# Patient Record
Sex: Male | Born: 1945 | Race: White | Hispanic: No | Marital: Married | Smoking: Never smoker
Health system: Southern US, Community
[De-identification: ages and names within clinical notes are randomized; demographics above are authoritative.]

## PROBLEM LIST (undated history)

## (undated) DIAGNOSIS — I2510 Atherosclerotic heart disease of native coronary artery without angina pectoris: Secondary | ICD-10-CM

## (undated) DIAGNOSIS — E669 Obesity, unspecified: Secondary | ICD-10-CM

## (undated) DIAGNOSIS — G4733 Obstructive sleep apnea (adult) (pediatric): Secondary | ICD-10-CM

## (undated) DIAGNOSIS — I10 Essential (primary) hypertension: Secondary | ICD-10-CM

## (undated) DIAGNOSIS — N401 Enlarged prostate with lower urinary tract symptoms: Secondary | ICD-10-CM

## (undated) DIAGNOSIS — E119 Type 2 diabetes mellitus without complications: Secondary | ICD-10-CM

## (undated) DIAGNOSIS — E538 Deficiency of other specified B group vitamins: Secondary | ICD-10-CM

## (undated) DIAGNOSIS — I252 Old myocardial infarction: Secondary | ICD-10-CM

## (undated) DIAGNOSIS — K219 Gastro-esophageal reflux disease without esophagitis: Secondary | ICD-10-CM

## (undated) DIAGNOSIS — R079 Chest pain, unspecified: Secondary | ICD-10-CM

## (undated) DIAGNOSIS — E559 Vitamin D deficiency, unspecified: Secondary | ICD-10-CM

## (undated) DIAGNOSIS — F411 Generalized anxiety disorder: Secondary | ICD-10-CM

## (undated) DIAGNOSIS — F4310 Post-traumatic stress disorder, unspecified: Secondary | ICD-10-CM

## (undated) DIAGNOSIS — E785 Hyperlipidemia, unspecified: Secondary | ICD-10-CM

## (undated) HISTORY — DX: Post-traumatic stress disorder, unspecified: F43.10

## (undated) HISTORY — DX: Type 2 diabetes mellitus without complications: E11.9

## (undated) HISTORY — DX: Essential (primary) hypertension: I10

## (undated) HISTORY — PX: TONSILLECTOMY AND ADENOIDECTOMY: SUR1326

## (undated) HISTORY — DX: Obstructive sleep apnea (adult) (pediatric): G47.33

## (undated) HISTORY — DX: Vitamin D deficiency, unspecified: E55.9

## (undated) HISTORY — DX: Old myocardial infarction: I25.2

## (undated) HISTORY — DX: Benign prostatic hyperplasia with lower urinary tract symptoms: N40.1

## (undated) HISTORY — DX: Generalized anxiety disorder: F41.1

## (undated) HISTORY — PX: CORONARY ANGIOPLASTY: SHX604

## (undated) HISTORY — DX: Atherosclerotic heart disease of native coronary artery without angina pectoris: I25.10

## (undated) HISTORY — DX: Obesity, unspecified: E66.9

## (undated) HISTORY — DX: Deficiency of other specified B group vitamins: E53.8

## (undated) HISTORY — DX: Chest pain, unspecified: R07.9

## (undated) HISTORY — DX: Gastro-esophageal reflux disease without esophagitis: K21.9

## (undated) HISTORY — PX: CARDIAC CATHETERIZATION: SHX172

## (undated) HISTORY — DX: Hyperlipidemia, unspecified: E78.5

---

## 2003-05-02 ENCOUNTER — Encounter: Admission: RE | Admit: 2003-05-02 | Discharge: 2003-07-31 | Payer: Self-pay | Admitting: Internal Medicine

## 2004-12-20 ENCOUNTER — Ambulatory Visit: Payer: Self-pay | Admitting: Internal Medicine

## 2004-12-20 ENCOUNTER — Inpatient Hospital Stay (HOSPITAL_COMMUNITY): Admission: EM | Admit: 2004-12-20 | Discharge: 2004-12-24 | Payer: Self-pay | Admitting: Internal Medicine

## 2005-01-10 ENCOUNTER — Ambulatory Visit: Payer: Self-pay | Admitting: Cardiology

## 2005-01-10 ENCOUNTER — Inpatient Hospital Stay (HOSPITAL_COMMUNITY): Admission: EM | Admit: 2005-01-10 | Discharge: 2005-01-11 | Payer: Self-pay | Admitting: Emergency Medicine

## 2005-03-30 HISTORY — PX: ESOPHAGOGASTRODUODENOSCOPY: SHX1529

## 2013-10-02 HISTORY — PX: COLONOSCOPY: SHX174

## 2017-10-25 ENCOUNTER — Telehealth: Payer: Self-pay | Admitting: Cardiology

## 2017-10-25 DIAGNOSIS — I2510 Atherosclerotic heart disease of native coronary artery without angina pectoris: Secondary | ICD-10-CM

## 2017-10-25 HISTORY — DX: Atherosclerotic heart disease of native coronary artery without angina pectoris: I25.10

## 2017-10-25 NOTE — Telephone Encounter (Signed)
Advised records have not been received, will continue efforts. Appointment moved up to tomorrow at 9:40 am per patient request.

## 2017-10-25 NOTE — Progress Notes (Signed)
Cardiology Office Note:    Date:  10/26/2017   ID:  Adam Kim, DOB 08-13-1946, MRN [id number]  PCP:  Raelyn Number, MD  Cardiologist:  Shirlee More, MD   Referring MD: Raelyn Number, MD  ASSESSMENT:    1. Coronary artery disease involving native coronary artery of native heart with angina pectoris (HCC)   2. Chest pain, unspecified type   3. Hyperlipidemia, unspecified hyperlipidemia type   4. Positive D dimer   5. Edema, unspecified type   6. Type 2 diabetes mellitus with complication, unspecified whether long term insulin use (HCC)    PLAN:    In order of problems listed above:  1. Course appears stable he has had one single episode of angina.  He will continue current medical treatment he is at risk for recurrent severe CAD will undergo stress myocardial perfusion study and follow-up with me in the office.  For completeness we will do a troponin today 2. See above symptoms most consistent with atypical angina  3. stable continue statin 4. D-dimer is elevated with age after 3 top normal his age x10 d-dimer age corrected is normal he had no shortness of breath no history of venous thromboembolism and at this time I would not perform CTA. 5. Likely due to venous insufficiency await results of previous duplex is requested 6. Stable managed by his PCP  Next appointment 3 weeks   Medication Adjustments/Labs and Tests Ordered: Current medicines are reviewed at length with the patient today.  Concerns regarding medicines are outlined above.  Orders Placed This Encounter  Procedures  . Troponin I  . Myocardial Perfusion Imaging   No orders of the defined types were placed in this encounter.    Chief Complaint  Patient presents with  . New Patient (Initial Visit)  . Coronary Artery Disease    History of Present Illness:    Adam Kim is a 72 y.o. male who is being seen today for the evaluation of CAD at the request of Haque, Imran P, MD.  He relates I had  seen him many years ago but certainly greater than 3-1/2 no records.  His cardiac intervention was performed at [facility] records requested.  He has had no cardiology ischemic evaluation evaluation since that time.  Last Friday while shaving he had a vague tightness substernally radiating to both sides of his chest and lasted for a few minutes and he sought medical attention.  He was not short of breath.  As part of his evaluation d-dimer is minimally elevated but corrected for age is normal he was not short of breath.  He relates in 2 separate occasions he has had duplex of his lower extremities because of edema and has no history of venous thromboembolism.  He has had no recurrent chest discomfort.  He is quite concerned and somewhat apprehensive since that day.  An echocardiogram was done as an outpatient result described as normal EKG at the office visit was normal and his CK-MB was performed that was normal.  His greatest concern is why he has edema.  I told him is not on the basis of heart failure and when asked he tells me he has large amounts of salt to his diet I suspect is chronic venous insufficiency I requested his previous duplexes and I asked him to begin sodium restriction. He was seen for chest pain by PCP, CPK MB, CBC normal, Chol 115, LDL 49 HDL 45 and echo 10/22/17 was normal.  Past  Medical History:  Diagnosis Date  . Benign prostatic hyperplasia with lower urinary tract symptoms 10/26/2017  . CAD (coronary artery disease), native coronary artery 10/25/2017  . Chest pain 10/26/2017  . Coronary artery disease   . Gastro-esophageal reflux disease without esophagitis 10/26/2017  . Generalized anxiety disorder 10/26/2017  . Hyperlipidemia   . Hypertension 10/26/2017  . Obesity 10/26/2017  . Obstructive sleep apnea 10/26/2017  . Old myocardial infarct 10/26/2017  . Type 2 diabetes mellitus ([place]) 10/26/2017  . Vitamin B 12 deficiency 10/26/2017  . Vitamin D deficiency 10/26/2017     Past Surgical History:  Procedure Laterality Date  . CARDIAC CATHETERIZATION    . CORONARY ANGIOPLASTY    . TONSILLECTOMY AND ADENOIDECTOMY      Current Medications: Current Meds  Medication Sig  . atorvastatin (LIPITOR) 10 MG tablet Take 10 mg by mouth daily.  Marland Kitchen CALCIUM-MAGNESIUM-VITAMIN D PO Take 1 tablet by mouth daily.  . candesartan (ATACAND) 4 MG tablet Take 4 mg by mouth daily.  . clopidogrel (PLAVIX) 75 MG tablet Take 75 mg by mouth daily.  . Cyanocobalamin 1000 MCG/ML KIT Inject 1,000 mcg as directed every 30 (thirty) days.  . diazepam (VALIUM) 5 MG tablet Take 5 mg by mouth every 6 (six) hours as needed for anxiety.  . empagliflozin (JARDIANCE) 25 MG TABS tablet Take 25 mg by mouth daily.  . fluticasone (FLONASE) 50 MCG/ACT nasal spray Place into both nostrils daily as needed for allergies or rhinitis.  . metFORMIN (GLUCOPHAGE) 500 MG tablet Take 500 mg by mouth. Takes 2 tablets in the am, 1 tablet at noon, and 2 tablets in the pm  . metoprolol succinate (TOPROL-XL) 25 MG 24 hr tablet Take 25 mg by mouth daily.  . montelukast (SINGULAIR) 10 MG tablet Take 10 mg by mouth at bedtime.  . pioglitazone (ACTOS) 45 MG tablet Take 45 mg by mouth daily.  . potassium chloride (K-DUR,KLOR-CON) 10 MEQ tablet Take 10 mEq by mouth daily.  . ranitidine (ZANTAC) 150 MG tablet Take 150 mg by mouth daily.  . sitaGLIPtin (JANUVIA) 100 MG tablet Take 100 mg by mouth daily.  . traZODone (DESYREL) 50 MG tablet Take 50 mg by mouth at bedtime.  . vitamin E 400 UNIT capsule Take 400 Units by mouth daily.  . [DISCONTINUED] aspirin EC 81 MG tablet Take 81 mg by mouth daily.     Allergies:   Iodine and Shellfish allergy   Social History   Socioeconomic History  . Marital status: Married    Spouse name: None  . Number of children: None  . Years of education: None  . Highest education level: None  Social Needs  . Financial resource strain: None  . Food insecurity - worry: None  . Food  insecurity - inability: None  . Transportation needs - medical: None  . Transportation needs - non-medical: None  Occupational History  . None  Tobacco Use  . Smoking status: Never Smoker  . Smokeless tobacco: Never Used  Substance and Sexual Activity  . Alcohol use: No    Frequency: Never  . Drug use: No  . Sexual activity: None  Other Topics Concern  . None  Social History Narrative  . None     Family History: The patient's family history includes Diabetes in his mother.  ROS:   ROS Please see the history of present illness.     All other systems reviewed and are negative.  EKGs/Labs/Other Studies Reviewed:    The following studies  were reviewed today:  D Dimer  EKG:  EKG 10/22/17 [place] poor R wave progression low voltage EKG    Physical Exam:    VS:  BP 116/60 (BP Location: Right Arm, Patient Position: Sitting, Cuff Size: Large)   Pulse 85   Ht 5' 7.5" (1.715 m)   Wt 263 lb 12.8 oz (119.7 kg)   SpO2 98%   BMI 40.71 kg/m     Wt Readings from Last 3 Encounters:  10/26/17 263 lb 12.8 oz (119.7 kg)     GEN: Obese BMI >40 , well developed in no acute distress HEENT: Normal NECK: No JVD; No carotid bruits LYMPHATICS: No lymphadenopathy CARDIAC: no chest wall tenderness RRR, no murmurs, rubs, gallops RESPIRATORY:  Clear to auscultation without rales, wheezing or rhonchi  ABDOMEN: Soft, non-tender, non-distended MUSCULOSKELETAL:  No edema; No deformity  SKIN: Warm and dry NEUROLOGIC:  Alert and oriented x 3 PSYCHIATRIC:  Normal affect     Signed, Shirlee More, MD  10/26/2017 11:43 AM    [place]

## 2017-10-25 NOTE — Telephone Encounter (Signed)
Patient has some questions and wanted to know if we got results from Dr. Judith Bl[place]Hawk in [place].

## 2017-10-26 ENCOUNTER — Encounter: Payer: Self-pay | Admitting: Cardiology

## 2017-10-26 ENCOUNTER — Ambulatory Visit (INDEPENDENT_AMBULATORY_CARE_PROVIDER_SITE_OTHER): Payer: Medicare Other | Admitting: Cardiology

## 2017-10-26 VITALS — BP 116/60 | HR 85 | Ht 67.5 in | Wt 263.8 lb

## 2017-10-26 DIAGNOSIS — E669 Obesity, unspecified: Secondary | ICD-10-CM

## 2017-10-26 DIAGNOSIS — I10 Essential (primary) hypertension: Secondary | ICD-10-CM

## 2017-10-26 DIAGNOSIS — G4733 Obstructive sleep apnea (adult) (pediatric): Secondary | ICD-10-CM | POA: Insufficient documentation

## 2017-10-26 DIAGNOSIS — E538 Deficiency of other specified B group vitamins: Secondary | ICD-10-CM | POA: Insufficient documentation

## 2017-10-26 DIAGNOSIS — E559 Vitamin D deficiency, unspecified: Secondary | ICD-10-CM | POA: Insufficient documentation

## 2017-10-26 DIAGNOSIS — K219 Gastro-esophageal reflux disease without esophagitis: Secondary | ICD-10-CM

## 2017-10-26 DIAGNOSIS — I25119 Atherosclerotic heart disease of native coronary artery with unspecified angina pectoris: Secondary | ICD-10-CM

## 2017-10-26 DIAGNOSIS — R609 Edema, unspecified: Secondary | ICD-10-CM

## 2017-10-26 DIAGNOSIS — E118 Type 2 diabetes mellitus with unspecified complications: Secondary | ICD-10-CM

## 2017-10-26 DIAGNOSIS — I252 Old myocardial infarction: Secondary | ICD-10-CM

## 2017-10-26 DIAGNOSIS — N401 Enlarged prostate with lower urinary tract symptoms: Secondary | ICD-10-CM | POA: Insufficient documentation

## 2017-10-26 DIAGNOSIS — F411 Generalized anxiety disorder: Secondary | ICD-10-CM | POA: Insufficient documentation

## 2017-10-26 DIAGNOSIS — R079 Chest pain, unspecified: Secondary | ICD-10-CM | POA: Diagnosis not present

## 2017-10-26 DIAGNOSIS — R7989 Other specified abnormal findings of blood chemistry: Secondary | ICD-10-CM

## 2017-10-26 DIAGNOSIS — E785 Hyperlipidemia, unspecified: Secondary | ICD-10-CM

## 2017-10-26 DIAGNOSIS — E119 Type 2 diabetes mellitus without complications: Secondary | ICD-10-CM

## 2017-10-26 HISTORY — DX: Vitamin D deficiency, unspecified: E55.9

## 2017-10-26 HISTORY — DX: Old myocardial infarction: I25.2

## 2017-10-26 HISTORY — DX: Deficiency of other specified B group vitamins: E53.8

## 2017-10-26 HISTORY — DX: Chest pain, unspecified: R07.9

## 2017-10-26 HISTORY — DX: Type 2 diabetes mellitus without complications: E11.9

## 2017-10-26 HISTORY — DX: Obstructive sleep apnea (adult) (pediatric): G47.33

## 2017-10-26 HISTORY — DX: Benign prostatic hyperplasia with lower urinary tract symptoms: N40.1

## 2017-10-26 HISTORY — DX: Generalized anxiety disorder: F41.1

## 2017-10-26 HISTORY — DX: Essential (primary) hypertension: I10

## 2017-10-26 HISTORY — DX: Obesity, unspecified: E66.9

## 2017-10-26 HISTORY — DX: Gastro-esophageal reflux disease without esophagitis: K21.9

## 2017-10-26 NOTE — Patient Instructions (Signed)
Medication Instructions:  Your physician recommends that you continue on your current medications as directed. Please refer to the Current Medication list given to you today.  Labwork: Your physician recommends that you return for lab work in: today. Troponin  Testing/Procedures: Your physician has requested that you have en exercise stress myoview. For further information please visit [email or web address]. Please follow instruction sheet, as given.  Follow-Up: Your physician recommends that you schedule a follow-up appointment in: 4 weeks.  Any Other Special Instructions Will Be Listed Below (If Applicable).     If you need a refill on your cardiac medications before your next appointment, please call your pharmacy.

## 2017-10-27 ENCOUNTER — Ambulatory Visit: Payer: Self-pay | Admitting: Cardiology

## 2017-10-27 LAB — TROPONIN I

## 2017-11-10 ENCOUNTER — Encounter (HOSPITAL_COMMUNITY): Payer: Medicare Other

## 2017-11-22 ENCOUNTER — Encounter (HOSPITAL_COMMUNITY): Payer: Medicare Other

## 2017-11-23 ENCOUNTER — Ambulatory Visit: Payer: Medicare Other | Admitting: Cardiology

## 2017-12-06 ENCOUNTER — Telehealth (HOSPITAL_COMMUNITY): Payer: Self-pay | Admitting: *Deleted

## 2017-12-06 NOTE — Telephone Encounter (Signed)
Patient given detailed instructions per Myocardial Perfusion Study Information Sheet for the test on 12/08/17 at 0730. Patient notified to arrive 15 minutes early and that it is imperative to arrive on time for appointment to keep from having the test rescheduled.  If you need to cancel or reschedule your appointment, please call the office within 24 hours of your appointment. . Patient verbalized understanding.Adam Kim, Adelene IdlerCynthia W

## 2017-12-08 ENCOUNTER — Ambulatory Visit (HOSPITAL_COMMUNITY): Payer: Medicare Other | Attending: Cardiology

## 2017-12-08 ENCOUNTER — Encounter (INDEPENDENT_AMBULATORY_CARE_PROVIDER_SITE_OTHER): Payer: Self-pay

## 2017-12-08 DIAGNOSIS — I25119 Atherosclerotic heart disease of native coronary artery with unspecified angina pectoris: Secondary | ICD-10-CM | POA: Insufficient documentation

## 2017-12-08 DIAGNOSIS — I10 Essential (primary) hypertension: Secondary | ICD-10-CM | POA: Insufficient documentation

## 2017-12-08 DIAGNOSIS — I259 Chronic ischemic heart disease, unspecified: Secondary | ICD-10-CM | POA: Diagnosis not present

## 2017-12-08 DIAGNOSIS — I2510 Atherosclerotic heart disease of native coronary artery without angina pectoris: Secondary | ICD-10-CM | POA: Insufficient documentation

## 2017-12-08 DIAGNOSIS — R079 Chest pain, unspecified: Secondary | ICD-10-CM

## 2017-12-08 DIAGNOSIS — E119 Type 2 diabetes mellitus without complications: Secondary | ICD-10-CM | POA: Diagnosis not present

## 2017-12-08 LAB — MYOCARDIAL PERFUSION IMAGING
CHL CUP NUCLEAR SDS: 2
CHL CUP NUCLEAR SRS: 4
CHL CUP RESTING HR STRESS: 67 {beats}/min
LHR: 0.42
LV dias vol: 113 mL (ref 62–150)
LV sys vol: 41 mL
NUC STRESS TID: 1.01
Peak HR: 93 {beats}/min
SSS: 6

## 2017-12-08 MED ORDER — REGADENOSON 0.4 MG/5ML IV SOLN
0.4000 mg | Freq: Once | INTRAVENOUS | Status: AC
  Administered 2017-12-08: 0.4 mg via INTRAVENOUS

## 2017-12-08 MED ORDER — TECHNETIUM TC 99M TETROFOSMIN IV KIT
11.0000 | PACK | Freq: Once | INTRAVENOUS | Status: AC | PRN
  Administered 2017-12-08: 11 via INTRAVENOUS
  Filled 2017-12-08: qty 11

## 2017-12-08 MED ORDER — TECHNETIUM TC 99M TETROFOSMIN IV KIT
31.1000 | PACK | Freq: Once | INTRAVENOUS | Status: AC | PRN
  Administered 2017-12-08: 31.1 via INTRAVENOUS
  Filled 2017-12-08: qty 32

## 2017-12-17 NOTE — Progress Notes (Signed)
Cardiology Office Note:    Date:  12/20/2017   ID:  Adam Kim, DOB 08-18-1946, MRN [id number]  PCP:  Raelyn Number, MD  Cardiologist:  Shirlee More, MD    Referring MD: Raelyn Number, MD    ASSESSMENT:    1. Coronary artery disease of native artery of native heart with stable angina pectoris ([place])   2. Essential hypertension   3. Hyperlipidemia, unspecified hyperlipidemia type    PLAN:    In order of problems listed above:  1. Stable his myocardial perfusion study showed minimal ischemia is having no angina at this time will continue medical therapy with clopidogrel beta-blocker and high intensity statin.  If he were to have frequent angina he would benefit from coronary angiography. 2. Stable continue current treatment including ARB 3. Stable continue his statin   Next appointment: 6 months   Medication Adjustments/Labs and Tests Ordered: Current medicines are reviewed at length with the patient today.  Concerns regarding medicines are outlined above.  No orders of the defined types were placed in this encounter.  No orders of the defined types were placed in this encounter.   Chief Complaint  Patient presents with  . Follow-up    to discuss stress test   . Coronary Artery Disease  . Hypertension  . Hyperlipidemia    History of Present Illness:    Adam Kim is a 72 y.o. male with a hx of CAD last seen 10/26/17.  ASSESSMENT:    10/26/17   1. Coronary artery disease involving native coronary artery of native heart with angina pectoris (HCC)   2. Chest pain, unspecified type   3. Hyperlipidemia, unspecified hyperlipidemia type   4. Positive D dimer   5. Edema, unspecified type   6. Type 2 diabetes mellitus with complication, unspecified whether long term insulin use (HCC)    PLAN:    In order of problems listed above:  1. Course appears stable he has had one single episode of angina.  He will continue current medical treatment he is at  risk for recurrent severe CAD will undergo stress myocardial perfusion study and follow-up with me in the office.  For completeness we will do a troponin today 2. See above symptoms most consistent with atypical angina  3. stable continue statin 4. D-dimer is elevated with age after 22 top normal his age x10 d-dimer age corrected is normal he had no shortness of breath no history of venous thromboembolism and at this time I would not perform CTA. 5. Likely due to venous insufficiency await results of previous duplex is requested 6. Stable managed by his PCP   Compliance with diet, lifestyle and medications: Yes He returns today in follow-up to his myocardial perfusion study that showed old infarction and minimal peri-infarct ischemia.  Clinically he is doing well he has no shortness of breath angina palpitation or syncope and until recently was in a regular exercise walking program.  Again is concerned about unilateral swelling in the right lower extremity is chronic he has had previous evaluation without evidence of deep vein thrombosis. Past Medical History:  Diagnosis Date  . Benign prostatic hyperplasia with lower urinary tract symptoms 10/26/2017  . CAD (coronary artery disease), native coronary artery 10/25/2017  . Chest pain 10/26/2017  . Coronary artery disease   . Gastro-esophageal reflux disease without esophagitis 10/26/2017  . Generalized anxiety disorder 10/26/2017  . Hyperlipidemia   . Hypertension 10/26/2017  . Obesity 10/26/2017  . Obstructive sleep apnea 10/26/2017  .  Old myocardial infarct 10/26/2017  . Type 2 diabetes mellitus (HCC) 10/26/2017  . Vitamin B 12 deficiency 10/26/2017  . Vitamin D deficiency 10/26/2017    Past Surgical History:  Procedure Laterality Date  . CARDIAC CATHETERIZATION    . CORONARY ANGIOPLASTY    . TONSILLECTOMY AND ADENOIDECTOMY      Current Medications: Current Meds  Medication Sig  . atorvastatin (LIPITOR) 10 MG tablet Take 10 mg by mouth  daily.  . CALCIUM-MAGNESIUM-VITAMIN D PO Take 1 tablet by mouth daily.  . candesartan (ATACAND) 4 MG tablet Take 4 mg by mouth daily.  . clopidogrel (PLAVIX) 75 MG tablet Take 75 mg by mouth daily.  . Cyanocobalamin 1000 MCG/ML KIT Inject 1,000 mcg as directed every 30 (thirty) days.  . diazepam (VALIUM) 5 MG tablet Take 5 mg by mouth every 6 (six) hours as needed for anxiety.  . empagliflozin (JARDIANCE) 25 MG TABS tablet Take 25 mg by mouth daily.  . fluticasone (FLONASE) 50 MCG/ACT nasal spray Place into both nostrils daily as needed for allergies or rhinitis.  . metFORMIN (GLUCOPHAGE) 500 MG tablet Take 500 mg by mouth. Takes 2 tablets in the am, 1 tablet at noon, and 2 tablets in the pm  . metoprolol succinate (TOPROL-XL) 25 MG 24 hr tablet Take 25 mg by mouth daily.  . montelukast (SINGULAIR) 10 MG tablet Take 10 mg by mouth at bedtime.  . pioglitazone (ACTOS) 45 MG tablet Take 45 mg by mouth daily.  . potassium chloride (K-DUR,KLOR-CON) 10 MEQ tablet Take 10 mEq by mouth daily.  . ranitidine (ZANTAC) 150 MG tablet Take 150 mg by mouth daily.  . traZODone (DESYREL) 50 MG tablet Take 50 mg by mouth at bedtime.  . vitamin E 400 UNIT capsule Take 400 Units by mouth daily.     Allergies:   Iodine and Shellfish allergy   Social History   Socioeconomic History  . Marital status: Married    Spouse name: Not on file  . Number of children: Not on file  . Years of education: Not on file  . Highest education level: Not on file  Occupational History  . Not on file  Social Needs  . Financial resource strain: Not on file  . Food insecurity:    Worry: Not on file    Inability: Not on file  . Transportation needs:    Medical: Not on file    Non-medical: Not on file  Tobacco Use  . Smoking status: Never Smoker  . Smokeless tobacco: Never Used  Substance and Sexual Activity  . Alcohol use: No    Frequency: Never  . Drug use: No  . Sexual activity: Not on file  Lifestyle  . Physical  activity:    Days per week: Not on file    Minutes per session: Not on file  . Stress: Not on file  Relationships  . Social connections:    Talks on phone: Not on file    Gets together: Not on file    Attends religious service: Not on file    Active member of club or organization: Not on file    Attends meetings of clubs or organizations: Not on file    Relationship status: Not on file  Other Topics Concern  . Not on file  Social History Narrative  . Not on file     Family History: The patient's family history includes Diabetes in his mother. ROS:   Please see the history of present illness.      All other systems reviewed and are negative.  EKGs/Labs/Other Studies Reviewed:    The following studies were reviewed today: MPI 30-Dec-2017 Study Highlights    Nuclear stress EF: 63%. Normal wall motion  There was no ST segment deviation noted during stress.  Defect 1: There is a medium defect present in the apical inferior and apex location.The defect is partially reversible. This represents infarct and ischemia.  Findings consistent with prior apical inferior myocardial infarction with peri-infarct ischemia.  This is an intermediate risk study.    Recent Labs: No results found for requested labs within last 8760 hours.  Recent Lipid Panel No results found for: CHOL, TRIG, HDL, CHOLHDL, VLDL, LDLCALC, LDLDIRECT  Physical Exam:    VS:  BP 120/60   Pulse 75   Ht 5' 7.5" (1.715 m)   Wt 264 lb 1.9 oz (119.8 kg)   SpO2 98%   BMI 40.76 kg/m     Wt Readings from Last 3 Encounters:  12/20/17 264 lb 1.9 oz (119.8 kg)  2017/12/30 263 lb (119.3 kg)  10/26/17 263 lb 12.8 oz (119.7 kg)     GEN:  Well nourished, well developed in no acute distress HEENT: Normal NECK: No JVD; No carotid bruits LYMPHATICS: No lymphadenopathy CARDIAC: RRR, no murmurs, rubs, gallops RESPIRATORY:  Clear to auscultation without rales, wheezing or rhonchi  ABDOMEN: Soft, non-tender,  non-distended MUSCULOSKELETAL:  No edema; No deformity  SKIN: Warm and dry NEUROLOGIC:  Alert and oriented x 3 PSYCHIATRIC:  Normal affect    Signed, Shirlee More, MD  12/20/2017 12:41 PM    [facility]

## 2017-12-20 ENCOUNTER — Encounter: Payer: Self-pay | Admitting: Cardiology

## 2017-12-20 ENCOUNTER — Ambulatory Visit (INDEPENDENT_AMBULATORY_CARE_PROVIDER_SITE_OTHER): Payer: Medicare Other | Admitting: Cardiology

## 2017-12-20 VITALS — BP 120/60 | HR 75 | Ht 67.5 in | Wt 264.1 lb

## 2017-12-20 DIAGNOSIS — E785 Hyperlipidemia, unspecified: Secondary | ICD-10-CM

## 2017-12-20 DIAGNOSIS — I10 Essential (primary) hypertension: Secondary | ICD-10-CM

## 2017-12-20 DIAGNOSIS — I25118 Atherosclerotic heart disease of native coronary artery with other forms of angina pectoris: Secondary | ICD-10-CM | POA: Diagnosis not present

## 2017-12-20 NOTE — Patient Instructions (Addendum)
Medication Instructions:  Your physician recommends that you continue on your current medications as directed. Please refer to the Current Medication list given to you today.  Labwork: None  Testing/Procedures: None  Follow-Up: Your physician wants you to follow-up in: 6 months. You will receive a reminder letter in the mail two months in advance. If you don't receive a letter, please call our office to schedule the follow-up appointment.  Any Other Special Instructions Will Be Listed Below (If Applicable).     If you need a refill on your cardiac medications before your next appointment, please call your pharmacy.    Activity 20-30 minutes a day

## 2017-12-23 ENCOUNTER — Telehealth: Payer: Self-pay | Admitting: Cardiology

## 2017-12-23 NOTE — Telephone Encounter (Signed)
Wants to know a good vascular surgeon

## 2017-12-23 NOTE — Telephone Encounter (Signed)
Patient states he was driving whenever I returned his call. Patient wanted to call back when he is able to write the information down. Dr. Dulce SellarMunley recommends Dr. Charlena CrossWells Brabham IV, MD for vascular surgery.

## 2017-12-24 NOTE — Telephone Encounter (Signed)
Contacted patient, he was in a meeting, will return call.

## 2017-12-27 NOTE — Telephone Encounter (Signed)
Patient advised of vascular surgeon's name.

## 2018-05-13 DIAGNOSIS — R600 Localized edema: Secondary | ICD-10-CM | POA: Insufficient documentation

## 2019-03-27 ENCOUNTER — Encounter (HOSPITAL_COMMUNITY): Payer: Self-pay

## 2019-03-27 ENCOUNTER — Inpatient Hospital Stay (HOSPITAL_COMMUNITY)
Admission: EM | Admit: 2019-03-27 | Discharge: 2019-04-02 | DRG: 439 | Disposition: A | Discharge status: Home / Self Care | Payer: Medicare Other | Attending: Student | Admitting: Student

## 2019-03-27 ENCOUNTER — Other Ambulatory Visit: Payer: Self-pay

## 2019-03-27 DIAGNOSIS — E119 Type 2 diabetes mellitus without complications: Secondary | ICD-10-CM | POA: Diagnosis present

## 2019-03-27 DIAGNOSIS — Z9861 Coronary angioplasty status: Secondary | ICD-10-CM

## 2019-03-27 DIAGNOSIS — Z1159 Encounter for screening for other viral diseases: Secondary | ICD-10-CM

## 2019-03-27 DIAGNOSIS — K219 Gastro-esophageal reflux disease without esophagitis: Secondary | ICD-10-CM | POA: Diagnosis present

## 2019-03-27 DIAGNOSIS — K859 Acute pancreatitis without necrosis or infection, unspecified: Principal | ICD-10-CM | POA: Diagnosis present

## 2019-03-27 DIAGNOSIS — Z7984 Long term (current) use of oral hypoglycemic drugs: Secondary | ICD-10-CM

## 2019-03-27 DIAGNOSIS — F411 Generalized anxiety disorder: Secondary | ICD-10-CM | POA: Diagnosis present

## 2019-03-27 DIAGNOSIS — E1169 Type 2 diabetes mellitus with other specified complication: Secondary | ICD-10-CM

## 2019-03-27 DIAGNOSIS — I10 Essential (primary) hypertension: Secondary | ICD-10-CM | POA: Diagnosis present

## 2019-03-27 DIAGNOSIS — N401 Enlarged prostate with lower urinary tract symptoms: Secondary | ICD-10-CM | POA: Diagnosis present

## 2019-03-27 DIAGNOSIS — Z7902 Long term (current) use of antithrombotics/antiplatelets: Secondary | ICD-10-CM

## 2019-03-27 DIAGNOSIS — I252 Old myocardial infarction: Secondary | ICD-10-CM

## 2019-03-27 DIAGNOSIS — G4733 Obstructive sleep apnea (adult) (pediatric): Secondary | ICD-10-CM | POA: Diagnosis present

## 2019-03-27 DIAGNOSIS — Z79899 Other long term (current) drug therapy: Secondary | ICD-10-CM

## 2019-03-27 DIAGNOSIS — I2510 Atherosclerotic heart disease of native coronary artery without angina pectoris: Secondary | ICD-10-CM | POA: Diagnosis present

## 2019-03-27 DIAGNOSIS — Z6841 Body Mass Index (BMI) 40.0 and over, adult: Secondary | ICD-10-CM

## 2019-03-27 DIAGNOSIS — Z833 Family history of diabetes mellitus: Secondary | ICD-10-CM

## 2019-03-27 DIAGNOSIS — E785 Hyperlipidemia, unspecified: Secondary | ICD-10-CM | POA: Diagnosis present

## 2019-03-27 LAB — COMPREHENSIVE METABOLIC PANEL
ALT: 25 U/L (ref 0–44)
AST: 49 U/L — ABNORMAL HIGH (ref 15–41)
Albumin: 3.8 g/dL (ref 3.5–5.0)
Alkaline Phosphatase: 61 U/L (ref 38–126)
Anion gap: 11 (ref 5–15)
BUN: 16 mg/dL (ref 8–23)
CO2: 23 mmol/L (ref 22–32)
Calcium: 8.8 mg/dL — ABNORMAL LOW (ref 8.9–10.3)
Chloride: 100 mmol/L (ref 98–111)
Creatinine, Ser: 1.16 mg/dL (ref 0.61–1.24)
GFR calc Af Amer: >60 mL/min (ref >60)
GFR calc non Af Amer: >60 mL/min (ref >60)
Glucose, Bld: 193 mg/dL — ABNORMAL HIGH (ref 70–99)
Potassium: 4 mmol/L (ref 3.5–5.1)
Sodium: 134 mmol/L — ABNORMAL LOW (ref 135–145)
Total Bilirubin: 0.7 mg/dL (ref 0.3–1.2)
Total Protein: 6.3 g/dL — ABNORMAL LOW (ref 6.5–8.1)

## 2019-03-27 LAB — CBC
HCT: 39.2 % (ref 39.0–52.0)
Hemoglobin: 12.3 g/dL — ABNORMAL LOW (ref 13.0–17.0)
MCH: 29.5 pg (ref 26.0–34.0)
MCHC: 31.4 g/dL (ref 30.0–36.0)
MCV: 94 fL (ref 80.0–100.0)
Platelets: 235 10*3/uL (ref 150–400)
RBC: 4.17 MIL/uL — ABNORMAL LOW (ref 4.22–5.81)
RDW: 13.9 % (ref 11.5–15.5)
WBC: 11 10*3/uL — ABNORMAL HIGH (ref 4.0–10.5)
nRBC: 0 % (ref 0.0–0.2)

## 2019-03-27 LAB — LIPASE, BLOOD: Lipase: 1495 U/L — ABNORMAL HIGH (ref 11–51)

## 2019-03-27 LAB — CBG MONITORING, ED: Glucose-Capillary: 193 mg/dL — ABNORMAL HIGH (ref 70–99)

## 2019-03-27 LAB — TROPONIN I (HIGH SENSITIVITY)
Troponin I (High Sensitivity): 4 ng/L (ref <18)
Troponin I (High Sensitivity): 5 ng/L (ref <18)

## 2019-03-27 MED ORDER — SODIUM CHLORIDE 0.9% FLUSH
3.0000 mL | Freq: Once | INTRAVENOUS | Status: AC
  Administered 2019-03-28: 3 mL via INTRAVENOUS

## 2019-03-27 MED ORDER — ONDANSETRON HCL 4 MG/2ML IJ SOLN
4.0000 mg | Freq: Once | INTRAMUSCULAR | Status: AC
  Administered 2019-03-27: 4 mg via INTRAVENOUS
  Filled 2019-03-27: qty 2

## 2019-03-27 MED ORDER — SODIUM CHLORIDE 0.9 % IV BOLUS
500.0000 mL | Freq: Once | INTRAVENOUS | Status: AC
  Administered 2019-03-27: 500 mL via INTRAVENOUS

## 2019-03-27 MED ORDER — ONDANSETRON 4 MG PO TBDP
4.0000 mg | ORAL_TABLET | Freq: Once | ORAL | Status: AC | PRN
  Administered 2019-03-27: 20:00:00 4 mg via ORAL
  Filled 2019-03-27: qty 1

## 2019-03-27 MED ORDER — HYDROMORPHONE HCL 1 MG/ML IJ SOLN
1.0000 mg | Freq: Once | INTRAMUSCULAR | Status: AC
  Administered 2019-03-27: 1 mg via INTRAVENOUS
  Filled 2019-03-27: qty 1

## 2019-03-27 NOTE — ED Notes (Signed)
Please call pt's wife to give her an update on pt. Adam Kim at [phone]

## 2019-03-28 ENCOUNTER — Observation Stay (HOSPITAL_COMMUNITY): Payer: Medicare Other

## 2019-03-28 ENCOUNTER — Emergency Department (HOSPITAL_COMMUNITY): Payer: Medicare Other

## 2019-03-28 ENCOUNTER — Encounter (HOSPITAL_COMMUNITY): Payer: Self-pay | Admitting: Family Medicine

## 2019-03-28 DIAGNOSIS — I25118 Atherosclerotic heart disease of native coronary artery with other forms of angina pectoris: Secondary | ICD-10-CM | POA: Diagnosis not present

## 2019-03-28 DIAGNOSIS — N401 Enlarged prostate with lower urinary tract symptoms: Secondary | ICD-10-CM | POA: Diagnosis present

## 2019-03-28 DIAGNOSIS — F411 Generalized anxiety disorder: Secondary | ICD-10-CM | POA: Diagnosis present

## 2019-03-28 DIAGNOSIS — G4733 Obstructive sleep apnea (adult) (pediatric): Secondary | ICD-10-CM

## 2019-03-28 DIAGNOSIS — E119 Type 2 diabetes mellitus without complications: Secondary | ICD-10-CM | POA: Diagnosis present

## 2019-03-28 DIAGNOSIS — K219 Gastro-esophageal reflux disease without esophagitis: Secondary | ICD-10-CM | POA: Diagnosis present

## 2019-03-28 DIAGNOSIS — E785 Hyperlipidemia, unspecified: Secondary | ICD-10-CM | POA: Diagnosis present

## 2019-03-28 DIAGNOSIS — E1169 Type 2 diabetes mellitus with other specified complication: Secondary | ICD-10-CM

## 2019-03-28 DIAGNOSIS — I10 Essential (primary) hypertension: Secondary | ICD-10-CM

## 2019-03-28 DIAGNOSIS — R110 Nausea: Secondary | ICD-10-CM | POA: Diagnosis not present

## 2019-03-28 DIAGNOSIS — I252 Old myocardial infarction: Secondary | ICD-10-CM | POA: Diagnosis not present

## 2019-03-28 DIAGNOSIS — K859 Acute pancreatitis without necrosis or infection, unspecified: Secondary | ICD-10-CM | POA: Diagnosis present

## 2019-03-28 DIAGNOSIS — Z9861 Coronary angioplasty status: Secondary | ICD-10-CM | POA: Diagnosis not present

## 2019-03-28 DIAGNOSIS — Z1159 Encounter for screening for other viral diseases: Secondary | ICD-10-CM | POA: Diagnosis not present

## 2019-03-28 DIAGNOSIS — Z6841 Body Mass Index (BMI) 40.0 and over, adult: Secondary | ICD-10-CM | POA: Diagnosis not present

## 2019-03-28 DIAGNOSIS — R1010 Upper abdominal pain, unspecified: Secondary | ICD-10-CM | POA: Diagnosis not present

## 2019-03-28 DIAGNOSIS — Z833 Family history of diabetes mellitus: Secondary | ICD-10-CM | POA: Diagnosis not present

## 2019-03-28 DIAGNOSIS — R945 Abnormal results of liver function studies: Secondary | ICD-10-CM | POA: Diagnosis not present

## 2019-03-28 DIAGNOSIS — Z7984 Long term (current) use of oral hypoglycemic drugs: Secondary | ICD-10-CM | POA: Diagnosis not present

## 2019-03-28 DIAGNOSIS — R740 Nonspecific elevation of levels of transaminase and lactic acid dehydrogenase [LDH]: Secondary | ICD-10-CM | POA: Diagnosis not present

## 2019-03-28 DIAGNOSIS — Z79899 Other long term (current) drug therapy: Secondary | ICD-10-CM | POA: Diagnosis not present

## 2019-03-28 DIAGNOSIS — Z7902 Long term (current) use of antithrombotics/antiplatelets: Secondary | ICD-10-CM | POA: Diagnosis not present

## 2019-03-28 DIAGNOSIS — I2510 Atherosclerotic heart disease of native coronary artery without angina pectoris: Secondary | ICD-10-CM | POA: Diagnosis present

## 2019-03-28 LAB — COMPREHENSIVE METABOLIC PANEL
ALT: 24 U/L (ref 0–44)
AST: 41 U/L (ref 15–41)
Albumin: 3.5 g/dL (ref 3.5–5.0)
Alkaline Phosphatase: 48 U/L (ref 38–126)
Anion gap: 8 (ref 5–15)
BUN: 15 mg/dL (ref 8–23)
CO2: 26 mmol/L (ref 22–32)
Calcium: 8.2 mg/dL — ABNORMAL LOW (ref 8.9–10.3)
Chloride: 101 mmol/L (ref 98–111)
Creatinine, Ser: 1.04 mg/dL (ref 0.61–1.24)
GFR calc Af Amer: >60 mL/min (ref >60)
GFR calc non Af Amer: >60 mL/min (ref >60)
Glucose, Bld: 193 mg/dL — ABNORMAL HIGH (ref 70–99)
Potassium: 4.8 mmol/L (ref 3.5–5.1)
Sodium: 135 mmol/L (ref 135–145)
Total Bilirubin: 1.3 mg/dL — ABNORMAL HIGH (ref 0.3–1.2)
Total Protein: 5.8 g/dL — ABNORMAL LOW (ref 6.5–8.1)

## 2019-03-28 LAB — CBC WITH DIFFERENTIAL/PLATELET
Abs Immature Granulocytes: 0.04 10*3/uL (ref 0.00–0.07)
Basophils Absolute: 0 10*3/uL (ref 0.0–0.1)
Basophils Relative: 0 %
Eosinophils Absolute: 0 10*3/uL (ref 0.0–0.5)
Eosinophils Relative: 0 %
HCT: 35.6 % — ABNORMAL LOW (ref 39.0–52.0)
Hemoglobin: 11.6 g/dL — ABNORMAL LOW (ref 13.0–17.0)
Immature Granulocytes: 0 %
Lymphocytes Relative: 13 %
Lymphs Abs: 1.3 10*3/uL (ref 0.7–4.0)
MCH: 29.9 pg (ref 26.0–34.0)
MCHC: 32.6 g/dL (ref 30.0–36.0)
MCV: 91.8 fL (ref 80.0–100.0)
Monocytes Absolute: 0.8 10*3/uL (ref 0.1–1.0)
Monocytes Relative: 8 %
Neutro Abs: 7.9 10*3/uL — ABNORMAL HIGH (ref 1.7–7.7)
Neutrophils Relative %: 79 %
Platelets: 237 10*3/uL (ref 150–400)
RBC: 3.88 MIL/uL — ABNORMAL LOW (ref 4.22–5.81)
RDW: 14.2 % (ref 11.5–15.5)
WBC: 10.1 10*3/uL (ref 4.0–10.5)
nRBC: 0 % (ref 0.0–0.2)

## 2019-03-28 LAB — URINALYSIS, ROUTINE W REFLEX MICROSCOPIC
Bacteria, UA: NONE SEEN
Bilirubin Urine: NEGATIVE
Glucose, UA: 150 mg/dL — AB
Ketones, ur: 5 mg/dL — AB
Leukocytes,Ua: NEGATIVE
Nitrite: NEGATIVE
Protein, ur: NEGATIVE mg/dL
Specific Gravity, Urine: 1.019 (ref 1.005–1.030)
pH: 5 (ref 5.0–8.0)

## 2019-03-28 LAB — LIPASE, BLOOD: Lipase: 582 U/L — ABNORMAL HIGH (ref 11–51)

## 2019-03-28 LAB — GLUCOSE, CAPILLARY
Glucose-Capillary: 152 mg/dL — ABNORMAL HIGH (ref 70–99)
Glucose-Capillary: 172 mg/dL — ABNORMAL HIGH (ref 70–99)
Glucose-Capillary: 144 mg/dL — ABNORMAL HIGH (ref 70–99)
Glucose-Capillary: 145 mg/dL — ABNORMAL HIGH (ref 70–99)
Glucose-Capillary: 132 mg/dL — ABNORMAL HIGH (ref 70–99)

## 2019-03-28 LAB — SARS CORONAVIRUS 2 BY RT PCR (HOSPITAL ORDER, PERFORMED IN ~~LOC~~ HOSPITAL LAB): SARS Coronavirus 2: NEGATIVE

## 2019-03-28 LAB — TRIGLYCERIDES: Triglycerides: 74 mg/dL (ref <150)

## 2019-03-28 MED ORDER — ONDANSETRON HCL 4 MG/2ML IJ SOLN
4.0000 mg | Freq: Four times a day (QID) | INTRAMUSCULAR | Status: DC | PRN
  Administered 2019-03-28 – 2019-04-01 (×7): 4 mg via INTRAVENOUS
  Filled 2019-03-28 (×7): qty 2

## 2019-03-28 MED ORDER — METOCLOPRAMIDE HCL 5 MG/ML IJ SOLN
10.0000 mg | Freq: Once | INTRAMUSCULAR | Status: AC
  Administered 2019-03-28: 02:00:00 10 mg via INTRAVENOUS
  Filled 2019-03-28: qty 2

## 2019-03-28 MED ORDER — HYDROMORPHONE HCL 1 MG/ML IJ SOLN
0.5000 mg | INTRAMUSCULAR | Status: DC | PRN
  Administered 2019-03-28 – 2019-03-31 (×10): 1 mg via INTRAVENOUS
  Administered 2019-03-31: 10:00:00 0.5 mg via INTRAVENOUS
  Administered 2019-04-01 (×3): 1 mg via INTRAVENOUS
  Filled 2019-03-28 (×15): qty 1

## 2019-03-28 MED ORDER — METOPROLOL SUCCINATE ER 25 MG PO TB24
25.0000 mg | ORAL_TABLET | Freq: Every day | ORAL | Status: DC
  Administered 2019-03-28 – 2019-04-02 (×6): 25 mg via ORAL
  Filled 2019-03-28 (×6): qty 1

## 2019-03-28 MED ORDER — ONDANSETRON HCL 4 MG PO TABS: 4.0000 mg | ORAL_TABLET | Freq: Four times a day (QID) | ORAL | Status: DC | PRN

## 2019-03-28 MED ORDER — INSULIN ASPART 100 UNIT/ML ~~LOC~~ SOLN
0.0000 [IU] | SUBCUTANEOUS | Status: DC
  Administered 2019-03-28: 21:00:00 1 [IU] via SUBCUTANEOUS
  Administered 2019-03-28: 09:00:00 2 [IU] via SUBCUTANEOUS
  Administered 2019-03-28 – 2019-03-29 (×4): 1 [IU] via SUBCUTANEOUS
  Administered 2019-03-29 (×2): 2 [IU] via SUBCUTANEOUS
  Administered 2019-03-30 (×3): 1 [IU] via SUBCUTANEOUS
  Administered 2019-03-31: 2 [IU] via SUBCUTANEOUS
  Administered 2019-03-31 – 2019-04-01 (×3): 1 [IU] via SUBCUTANEOUS
  Administered 2019-04-01 (×2): 2 [IU] via SUBCUTANEOUS

## 2019-03-28 MED ORDER — SODIUM CHLORIDE 0.9 % IV SOLN
INTRAVENOUS | Status: AC
  Administered 2019-03-28: 04:00:00 via INTRAVENOUS

## 2019-03-28 MED ORDER — CLOPIDOGREL BISULFATE 75 MG PO TABS
75.0000 mg | ORAL_TABLET | Freq: Every day | ORAL | Status: DC
  Administered 2019-03-28 – 2019-04-02 (×6): 75 mg via ORAL
  Filled 2019-03-28 (×6): qty 1

## 2019-03-28 MED ORDER — ENOXAPARIN SODIUM 40 MG/0.4ML ~~LOC~~ SOLN
40.0000 mg | SUBCUTANEOUS | Status: DC
  Administered 2019-03-30 – 2019-04-02 (×4): 40 mg via SUBCUTANEOUS
  Filled 2019-03-28 (×6): qty 0.4

## 2019-03-28 MED ORDER — SIMETHICONE 40 MG/0.6ML PO SUSP
40.0000 mg | Freq: Four times a day (QID) | ORAL | Status: DC | PRN
  Administered 2019-03-28 – 2019-04-01 (×8): 40 mg via ORAL
  Filled 2019-03-28 (×12): qty 0.6

## 2019-03-28 MED ORDER — ACETAMINOPHEN 650 MG RE SUPP: 650.0000 mg | Freq: Four times a day (QID) | RECTAL | Status: DC | PRN

## 2019-03-28 MED ORDER — HYDRALAZINE HCL 20 MG/ML IJ SOLN: 10.0000 mg | INTRAMUSCULAR | Status: DC | PRN

## 2019-03-28 MED ORDER — ACETAMINOPHEN 325 MG PO TABS
650.0000 mg | ORAL_TABLET | Freq: Four times a day (QID) | ORAL | Status: DC | PRN
  Filled 2019-03-28 (×2): qty 2

## 2019-03-28 NOTE — ED Provider Notes (Signed)
[facility] EMERGENCY DEPARTMENT Provider Note   CSN: [id number] Arrival date & time: 03/27/19  1745    History   Chief Complaint Chief Complaint  Patient presents with  . Chest Pain  . Abdominal Pain    HPI Adam Kim is a 73 y.o. male.     Patient presents to the emergency department for evaluation of epigastric pain and nausea.  Patient reports that he started feeling poorly earlier today.  He just did not feel right, had some mild nausea after eating lunch.  This evening, however, he started having sharp pains in the center of his abdomen.  He had a small bowel movement earlier, since then has felt like he needs to pass gas but cannot.  He has not had any fever.  No shortness of breath.     Past Medical History:  Diagnosis Date  . Benign prostatic hyperplasia with lower urinary tract symptoms 10/26/2017  . CAD (coronary artery disease), native coronary artery 10/25/2017  . Chest pain 10/26/2017  . Coronary artery disease   . Gastro-esophageal reflux disease without esophagitis 10/26/2017  . Generalized anxiety disorder 10/26/2017  . Hyperlipidemia   . Hypertension 10/26/2017  . Obesity 10/26/2017  . Obstructive sleep apnea 10/26/2017  . Old myocardial infarct 10/26/2017  . Type 2 diabetes mellitus ([place]) 10/26/2017  . Vitamin B 12 deficiency 10/26/2017  . Vitamin D deficiency 10/26/2017    Patient Active Problem List   Diagnosis Date Noted  . Acute pancreatitis 03/28/2019  . Hyperlipidemia 10/26/2017  . Type 2 diabetes mellitus ([place]) 10/26/2017  . Hypertension 10/26/2017  . Obesity 10/26/2017  . Chest pain 10/26/2017  . Obstructive sleep apnea 10/26/2017  . Generalized anxiety disorder 10/26/2017  . Old myocardial infarct 10/26/2017  . Gastro-esophageal reflux disease without esophagitis 10/26/2017  . Benign prostatic hyperplasia with lower urinary tract symptoms 10/26/2017  . Vitamin B 12 deficiency 10/26/2017  . Vitamin D deficiency  10/26/2017  . CAD (coronary artery disease), native coronary artery 10/25/2017    Past Surgical History:  Procedure Laterality Date  . CARDIAC CATHETERIZATION    . CORONARY ANGIOPLASTY    . TONSILLECTOMY AND ADENOIDECTOMY          Home Medications    Prior to Admission medications   Medication Sig Start Date End Date Taking? Authorizing Provider  atorvastatin (LIPITOR) 10 MG tablet Take 10 mg by mouth daily at 6 PM.    Yes [provider]  CALCIUM-MAGNESIUM-ZINC PO Take 1 tablet by mouth daily.   Yes [provider]  candesartan (ATACAND) 4 MG tablet Take 4 mg by mouth daily.   Yes [provider]  clopidogrel (PLAVIX) 75 MG tablet Take 75 mg by mouth daily.   Yes [provider]  Melatonin 5 MG TABS Take 5 mg by mouth at bedtime.   Yes [provider]  metFORMIN (GLUCOPHAGE) 500 MG tablet Take 500-1,000 mg by mouth See admin instructions. Takes 2 tablets every morning, take 1 tablet at noon, and 2 tablets in the evening   Yes [provider]  metoprolol succinate (TOPROL-XL) 25 MG 24 hr tablet Take 25 mg by mouth daily.   Yes [provider]  montelukast (SINGULAIR) 10 MG tablet Take 10 mg by mouth at bedtime.   Yes [provider]  pioglitazone (ACTOS) 45 MG tablet Take 45 mg by mouth daily.   Yes [provider]  potassium chloride (MICRO-K) 10 MEQ CR capsule Take 10 mEq by mouth daily.  Yes [provider]  vitamin E 400 UNIT capsule Take 400 Units by mouth daily.   Yes [provider]    Family History Family History  Problem Relation Age of Onset  . Diabetes Mother     Social History Social History   Tobacco Use  . Smoking status: Never Smoker  . Smokeless tobacco: Never Used  Substance Use Topics  . Alcohol use: No    Frequency: Never  . Drug use: No     Allergies   Iodine and Shellfish allergy   Review of Systems Review of Systems  Gastrointestinal:  Positive for abdominal pain and nausea.  All other systems reviewed and are negative.    Physical Exam Updated Vital Signs BP (!) 113/47   Pulse 81   Temp 98.2 F (36.8 C) (Oral)   Resp 18   SpO2 93%   Physical Exam Vitals signs and nursing note reviewed.  Constitutional:      General: He is not in acute distress.    Appearance: Normal appearance. He is well-developed.  HENT:     Head: Normocephalic and atraumatic.     Right Ear: Hearing normal.     Left Ear: Hearing normal.     Nose: Nose normal.  Eyes:     Conjunctiva/sclera: Conjunctivae normal.     Pupils: Pupils are equal, round, and reactive to light.  Neck:     Musculoskeletal: Normal range of motion and neck supple.  Cardiovascular:     Rate and Rhythm: Regular rhythm.     Heart sounds: S1 normal and S2 normal. No murmur. No friction rub. No gallop.   Pulmonary:     Effort: Pulmonary effort is normal. No respiratory distress.     Breath sounds: Normal breath sounds.  Chest:     Chest wall: No tenderness.  Abdominal:     General: Bowel sounds are normal.     Palpations: Abdomen is soft.     Tenderness: There is abdominal tenderness in the epigastric area. There is no guarding or rebound. Negative signs include Murphy's sign and McBurney's sign.     Hernia: No hernia is present.  Musculoskeletal: Normal range of motion.  Skin:    General: Skin is warm and dry.     Findings: No rash.  Neurological:     Mental Status: He is alert and oriented to person, place, and time.     GCS: GCS eye subscore is 4. GCS verbal subscore is 5. GCS motor subscore is 6.     Cranial Nerves: No cranial nerve deficit.     Sensory: No sensory deficit.     Coordination: Coordination normal.  Psychiatric:        Speech: Speech normal.        Behavior: Behavior normal.        Thought Content: Thought content normal.      ED Treatments / Results  Labs (all labs ordered are listed, but only abnormal results are displayed) Labs  Reviewed  LIPASE, BLOOD - Abnormal; Notable for the following components:      Result Value   Lipase 1,495 (*)    All other components within normal limits  COMPREHENSIVE METABOLIC PANEL - Abnormal; Notable for the following components:   Sodium 134 (*)    Glucose, Bld 193 (*)    Calcium 8.8 (*)    Total Protein 6.3 (*)    AST 49 (*)    All other components within normal limits  CBC -  Abnormal; Notable for the following components:   WBC 11.0 (*)    RBC 4.17 (*)    Hemoglobin 12.3 (*)    All other components within normal limits  CBG MONITORING, ED - Abnormal; Notable for the following components:   Glucose-Capillary 193 (*)    All other components within normal limits  SARS CORONAVIRUS 2 (HOSPITAL ORDER, PERFORMED IN [facility])  TROPONIN I (HIGH SENSITIVITY)  TROPONIN I (HIGH SENSITIVITY)  URINALYSIS, ROUTINE W REFLEX MICROSCOPIC    EKG EKG Interpretation  Date/Time:  Monday March 27 2019 18:01:36 EDT Ventricular Rate:  68 PR Interval:  140 QRS Duration: 96 QT Interval:  406 QTC Calculation: 431 R Axis:   1 Text Interpretation:  Normal sinus rhythm Anterior infarct , age undetermined Abnormal ECG No significant change since last tracing Confirmed by Gilda Creaseollina, Shiquita Collignon J [phone]([id number]) on 03/27/2019 11:43:34 PM   Radiology Ct Abdomen Pelvis Wo Contrast  Result Date: 03/28/2019 CLINICAL DATA:  Abdominal pain. Pancreatitis. Evaluate for choledocholithiasis EXAM: CT ABDOMEN AND PELVIS WITHOUT CONTRAST TECHNIQUE: Multidetector CT imaging of the abdomen and pelvis was performed following the standard protocol without IV contrast. COMPARISON:  None. FINDINGS: Lower chest: Lung bases are clear. No effusions. Heart is normal size. Coronary artery calcifications noted. Hepatobiliary: No focal hepatic abnormality. Gallbladder unremarkable. No visible gallstones. Pancreas: Stranding around the pancreas compatible with acute pancreatitis. No focal abnormality or ductal  dilatation. Mild fatty replacement. Spleen: No focal abnormality.  Normal size. Adrenals/Urinary Tract: Nonobstructing stone in the midpole of the left kidney. No hydronephrosis. No renal or adrenal mass. Urinary bladder unremarkable. Stomach/Bowel: Left colonic diverticulosis. No active diverticulitis. Appendix is normal. Small duodenal diverticulum. Stomach is decompressed. No evidence of bowel obstruction. Vascular/Lymphatic: No evidence of aneurysm or adenopathy. Aortic atherosclerosis. Reproductive: No visible focal abnormality. Other: No free fluid or free air. Musculoskeletal: No acute bony abnormality. IMPRESSION: Stranding around the pancreas compatible with acute pancreatitis. No visible gallstones. Left nephrolithiasis.  No hydronephrosis. Left colonic diverticulosis.  No active diverticulitis. Aortic atherosclerosis, coronary artery disease. Electronically Signed   By: Charlett NoseKevin  Dover M.D.   On: 03/28/2019 00:47    Procedures Procedures (including critical care time)  Medications Ordered in ED Medications  sodium chloride flush (NS) 0.9 % injection 3 mL (3 mLs Intravenous Given 03/28/19 0001)  ondansetron (ZOFRAN-ODT) disintegrating tablet 4 mg (4 mg Oral Given 03/27/19 2026)  sodium chloride 0.9 % bolus 500 mL (0 mLs Intravenous Stopped 03/28/19 0114)  ondansetron (ZOFRAN) injection 4 mg (4 mg Intravenous Given 03/27/19 2359)  HYDROmorphone (DILAUDID) injection 1 mg (1 mg Intravenous Given 03/27/19 2359)  metoCLOPramide (REGLAN) injection 10 mg (10 mg Intravenous Given 03/28/19 0153)     Initial Impression / Assessment and Plan / ED Course  I have reviewed the triage vital signs and the nursing notes.  Pertinent labs & imaging results that were available during my care of the patient were reviewed by me and considered in my medical decision making (see chart for details).        Patient presents to the emergency department for evaluation of abdominal pain with nausea.  Symptoms began  earlier today and worsened tonight.  Abdominal exam revealed mild upper abdominal tenderness without guarding or rebound.  Blood work shows significant elevation of lipase with normal LFTs.  Patient does not drink alcohol.  He underwent CT abdomen and pelvis to further evaluate.  No gallstone or choledocholithiasis noted on CT scan.  There are inflammatory changes consistent with acute pancreatitis  noted.  Etiology is unclear at this time.  Will admit patient for further treatment of his abdominal pain and intractable nausea.  Final Clinical Impressions(s) / ED Diagnoses   Final diagnoses:  Acute pancreatitis without infection or necrosis, unspecified pancreatitis type    ED Discharge Orders    None       Gilda CreasePollina, Teona Vargus J, MD 03/28/19 [phone]

## 2019-03-28 NOTE — Progress Notes (Signed)
PROGRESS NOTE  Adam Adam Kim:[id number] DOB: 02/06/1946 DOA: 03/27/2019 PCP: Shelbie AmmonsHaque, Imran P, MD  Brief History    Adam Kim is a 73 y.o. male with medical history significant for type 2 diabetes mellitus, hypertension, coronary artery disease, and OSA, now presenting to the emergency department for evaluation of nausea and epigastric abdominal pain.  Patient reports that he was in his usual state of health and had an uneventful morning, but developed nausea after lunch that progressively worsened, but without much vomiting.  He went on to develop sharp pain in the epigastrium that was waxing and waning, severe at times, and localized.  He had a small bowel movement earlier in the day.  Denies any recent fevers or chills, has not been coughing, and denies any chest pain.  Patient reports experiencing similar pain several years ago and states that he was treated with Nexium and treated for H. pylori at that time, and the symptoms completely resolved.  Reports that he recently stopped taking ranitidine but has not started any new medications.  ED Course: Upon arrival to the ED, patient is found to be afebrile, saturating adequately on room air, and with remaining vitals also stable.  EKG features normal sinus rhythm and CT the abdomen and pelvis demonstrates stranding about the pancreas consistent with acute pancreatitis, but no visible cholelithiasis.  Chemistry panel features a slight hyponatremia, slight elevation in AST, normal bilirubin, and lipase elevated 1495.  CBC features mild leukocytosis and troponin is negative x2.  Patient was given 500 cc normal saline, Reglan, Zofran, and Dilaudid in the ED.  He is improved but remains uncomfortable despite all of this and hospitalists are asked to admit.  The patient was admitted to a medical bed. He has been kept NPO. He has only used norco once since admission.   Consultants  . None  Procedures  . None  Antibiotics   Anti-infectives  (From admission, onward)   None    .  Marland Kitchen.   Subjective  The patient is resting comfortably. He states that he continues to have epigastric discomfort, but has not wanted to ask for pain medication since last night.  Objective   Vitals:  Vitals:   03/28/19 0345 03/28/19 0847  BP: (!) 145/59 (!) 152/60  Pulse: 83 88  Resp: 20 18  Temp: 98.2 F (36.8 C) 99.1 F (37.3 C)  SpO2: 99% 99%    Exam:  Constitutional:  . The patient is awake, alert, and orietned. Respiratory:  . No increased work of breathing. . No wheezes, rales, or rhonchi. . No tactile fremitus. Cardiovascular:  . Regular rate and rhythm. . No murmurs, ectopy, or gallups . No lateral PMI. No thrills. Abdomen:  . Abdomen is obese. Somewhat distended. Positive for tenderness in epigastrum. . No hernias, masses, or organomegaly. . Hypoactive bowel sounds. Musculoskeletal:  . No cyanosis, clubbing, or edema Skin:  . No rashes, lesions, ulcers . palpation of skin: no induration or nodules Neurologic:  . CN 2-12 intact . Sensation all 4 extremities intact Psychiatric:  . Mental status o Mood, affect appropriate o Orientation to person, place, time  . judgment and insight appear intact    I have personally reviewed the following:   Today's Data  . CBC, Lipase, CMP. Triglycerides .  Imaging  Right upper quadrant ultrasound: No acute finding or biliary calculus  Scheduled Meds: . enoxaparin (LOVENOX) injection  40 mg Subcutaneous Q24H  . insulin aspart  0-9 Units Subcutaneous Q4H   Continuous Infusions: .  sodium chloride 100 mL/hr at 03/28/19 1751    Principal Problem:   Acute pancreatitis Active Problems:   CAD (coronary artery disease), native coronary artery   Type 2 diabetes mellitus (HCC)   Hypertension   Obstructive sleep apnea   LOS: 0 days   A & P  Acute pancreatitis: Presents with one day of severe nausea and epigastric pain, found to have lipase of 1495 and inflammatory changes  about the pancreas on CT. RUQ ultrasound and CT negative for visualized calculus or evidence of biliary disease. The patient has initially been kept NPO with pain control and antiemetics and IVF. This morning he is still having pain, but is interested in clear liquids. Will start these and advance as tolerated. Consider HIDA scan if patient remains symptomatic in the am. Triglycerides are unremarkable and the patient denies family history or alcohol use. Among PTA meds is lipitor which may be a cause for pancreatitis. This is being held.   Type II DM: No A1c on file. I will order one. Actos and metformin have been held. The patient's glucoses are being managed with QAC and HS FSBS and SSI.  Hypertension: Blood pressures are under fair control. Atacand and metoprolol are being held. Use hydralazine IVP as needed while NPO    4. CAD: Noted. Stable. Resume Plavix. Continue to hold statin as it may be a cause for pancreatitis.  The patient is resting comfortably. No new complaints.  PPE: Mask, face shield. Patient wearing mask.  DVT prophylaxis: Lovenox  Code Status: Full  Family Communication: Discussed with patient   Quinnetta Roepke, DO [facility] Direct contact: see [email or web address]  7PM-7AM contact night coverage as above  03/28/2019, 1:29 PM  LOS: 0 days        d

## 2019-03-28 NOTE — Progress Notes (Signed)
Came to assess patient readiness for CPAP for the night. On arrival patient was on his HOME Unit CPAP. Patient  Stating that he is fine.

## 2019-03-28 NOTE — H&P (Signed)
History and Physical    Adam Kim WJX:[id number] DOB: 05/03/1946 DOA: 03/27/2019  PCP: Shelbie AmmonsHaque, Imran P, MD   Patient coming from: Home   Chief Complaint: Epigastric pain, nausea   HPI: Adam Kim is a 73 y.o. male with medical history significant for type 2 diabetes mellitus, hypertension, coronary artery disease, and OSA, now presenting to the emergency department for evaluation of nausea and epigastric abdominal pain.  Patient reports that he was in his usual state of health and had an uneventful morning, but developed nausea after lunch that progressively worsened, but without much vomiting.  He went on to develop sharp pain in the epigastrium that was waxing and waning, severe at times, and localized.  He had a small bowel movement earlier in the day.  Denies any recent fevers or chills, has not been coughing, and denies any chest pain.  Patient reports experiencing similar pain several years ago and states that he was treated with Nexium and treated for H. pylori at that time, and the symptoms completely resolved.  Reports that he recently stopped taking ranitidine but has not started any new medications.  ED Course: Upon arrival to the ED, patient is found to be afebrile, saturating adequately on room air, and with remaining vitals also stable.  EKG features normal sinus rhythm and CT the abdomen and pelvis demonstrates stranding about the pancreas consistent with acute pancreatitis, but no visible cholelithiasis.  Chemistry panel features a slight hyponatremia, slight elevation in AST, normal bilirubin, and lipase elevated 1495.  CBC features mild leukocytosis and troponin is negative x2.  Patient was given 500 cc normal saline, Reglan, Zofran, and Dilaudid in the ED.  He is improved but remains uncomfortable despite all of this and hospitalists are asked to admit.  Review of Systems:  All other systems reviewed and apart from HPI, are negative.  Past Medical History:  Diagnosis  Date  . Benign prostatic hyperplasia with lower urinary tract symptoms 10/26/2017  . CAD (coronary artery disease), native coronary artery 10/25/2017  . Chest pain 10/26/2017  . Coronary artery disease   . Gastro-esophageal reflux disease without esophagitis 10/26/2017  . Generalized anxiety disorder 10/26/2017  . Hyperlipidemia   . Hypertension 10/26/2017  . Obesity 10/26/2017  . Obstructive sleep apnea 10/26/2017  . Old myocardial infarct 10/26/2017  . Type 2 diabetes mellitus (HCC) 10/26/2017  . Vitamin B 12 deficiency 10/26/2017  . Vitamin D deficiency 10/26/2017    Past Surgical History:  Procedure Laterality Date  . CARDIAC CATHETERIZATION    . CORONARY ANGIOPLASTY    . TONSILLECTOMY AND ADENOIDECTOMY       reports that he has never smoked. He has never used smokeless tobacco. He reports that he does not drink alcohol or use drugs.  Allergies  Allergen Reactions  . Iodine Other (See Comments)    ask  . Shellfish Allergy Other (See Comments)    ask    Family History  Problem Relation Age of Onset  . Diabetes Mother      Prior to Admission medications   Medication Sig Start Date End Date Taking? Authorizing Provider  atorvastatin (LIPITOR) 10 MG tablet Take 10 mg by mouth daily at 6 PM.    Yes [provider]  CALCIUM-MAGNESIUM-ZINC PO Take 1 tablet by mouth daily.   Yes [provider]  candesartan (ATACAND) 4 MG tablet Take 4 mg by mouth daily.   Yes [provider]  clopidogrel (PLAVIX) 75 MG tablet Take 75 mg by mouth daily.  Yes [provider]  Melatonin 5 MG TABS Take 5 mg by mouth at bedtime.   Yes [provider]  metFORMIN (GLUCOPHAGE) 500 MG tablet Take 500-1,000 mg by mouth See admin instructions. Takes 2 tablets every morning, take 1 tablet at noon, and 2 tablets in the evening   Yes [provider]  metoprolol succinate (TOPROL-XL) 25 MG 24 hr tablet Take 25 mg by mouth daily.   Yes [provider]   montelukast (SINGULAIR) 10 MG tablet Take 10 mg by mouth at bedtime.   Yes [provider]  pioglitazone (ACTOS) 45 MG tablet Take 45 mg by mouth daily.   Yes [provider]  potassium chloride (MICRO-K) 10 MEQ CR capsule Take 10 mEq by mouth daily.   Yes [provider]  vitamin E 400 UNIT capsule Take 400 Units by mouth daily.   Yes [provider]    Physical Exam: Vitals:   03/27/19 2258 03/27/19 2345 03/28/19 0000 03/28/19 0100  BP:  (!) 147/55 (!) 134/52 (!) 113/47  Pulse: 81 88 75 81  Resp: (!) 27 (!) 27 (!) 23 18  Temp:      TempSrc:      SpO2: 98% 99% 98% 93%    Constitutional: NAD, appears uncomfortable  Eyes: PERTLA, lids and conjunctivae normal ENMT: Mucous membranes are moist. Posterior pharynx clear of any exudate or lesions.   Neck: normal, supple, no masses, no thyromegaly Respiratory: clear to auscultation bilaterally, no wheezing, no crackles. No accessory muscle use.  Cardiovascular: S1 & S2 heard, regular rate and rhythm. Pretibial pitting edema bilaterally. Abdomen: Soft. Tender in epigastrium, no rebound pain or guarding. Bowel sounds active.  Musculoskeletal: no clubbing / cyanosis. No joint deformity upper and lower extremities.    Skin: no significant rashes, lesions, ulcers. Warm, dry, well-perfused. Neurologic: CN 2-12 grossly intact. Sensation intact. Moving all extremities.  Psychiatric: Alert and oriented x 3. Very pleasant, cooperative.    Labs on Admission: I have personally reviewed following labs and imaging studies  CBC: Recent Labs  Lab 03/27/19 1805  WBC 11.0*  HGB 12.3*  HCT 39.2  MCV 94.0  PLT 924   Basic Metabolic Panel: Recent Labs  Lab 03/27/19 1805  NA 134*  K 4.0  CL 100  CO2 23  GLUCOSE 193*  BUN 16  CREATININE 1.16  CALCIUM 8.8*   GFR: CrCl cannot be calculated (Unknown ideal weight.). Liver Function Tests: Recent Labs  Lab 03/27/19 1805  AST 49*  ALT 25  ALKPHOS 61   BILITOT 0.7  PROT 6.3*  ALBUMIN 3.8   Recent Labs  Lab 03/27/19 1805  LIPASE 1,495*   No results for input(s): AMMONIA in the last 168 hours. Coagulation Profile: No results for input(s): INR, PROTIME in the last 168 hours. Cardiac Enzymes: No results for input(s): CKTOTAL, CKMB, CKMBINDEX, TROPONINI in the last 168 hours. BNP (last 3 results) No results for input(s): PROBNP in the last 8760 hours. HbA1C: No results for input(s): HGBA1C in the last 72 hours. CBG: Recent Labs  Lab 03/27/19 2028  GLUCAP 193*   Lipid Profile: No results for input(s): CHOL, HDL, LDLCALC, TRIG, CHOLHDL, LDLDIRECT in the last 72 hours. Thyroid Function Tests: No results for input(s): TSH, T4TOTAL, FREET4, T3FREE, THYROIDAB in the last 72 hours. Anemia Panel: No results for input(s): VITAMINB12, FOLATE, FERRITIN, TIBC, IRON, RETICCTPCT in the last 72 hours. Urine analysis: No results found for: COLORURINE, APPEARANCEUR, [place], [place], [place], [place], [place], [place], [place], [place],  NITRITE, LEUKOCYTESUR Sepsis Labs: @LABRCNTIP (procalcitonin:4,lacticidven:4) )No results found for this or any previous visit (from the past 240 hour(s)).   Radiological Exams on Admission: Ct Abdomen Pelvis Wo Contrast  Result Date: 03/28/2019 CLINICAL DATA:  Abdominal pain. Pancreatitis. Evaluate for choledocholithiasis EXAM: CT ABDOMEN AND PELVIS WITHOUT CONTRAST TECHNIQUE: Multidetector CT imaging of the abdomen and pelvis was performed following the standard protocol without IV contrast. COMPARISON:  None. FINDINGS: Lower chest: Lung bases are clear. No effusions. Heart is normal size. Coronary artery calcifications noted. Hepatobiliary: No focal hepatic abnormality. Gallbladder unremarkable. No visible gallstones. Pancreas: Stranding around the pancreas compatible with acute pancreatitis. No focal abnormality or ductal dilatation. Mild fatty replacement. Spleen: No focal abnormality.  Normal  size. Adrenals/Urinary Tract: Nonobstructing stone in the midpole of the left kidney. No hydronephrosis. No renal or adrenal mass. Urinary bladder unremarkable. Stomach/Bowel: Left colonic diverticulosis. No active diverticulitis. Appendix is normal. Small duodenal diverticulum. Stomach is decompressed. No evidence of bowel obstruction. Vascular/Lymphatic: No evidence of aneurysm or adenopathy. Aortic atherosclerosis. Reproductive: No visible focal abnormality. Other: No free fluid or free air. Musculoskeletal: No acute bony abnormality. IMPRESSION: Stranding around the pancreas compatible with acute pancreatitis. No visible gallstones. Left nephrolithiasis.  No hydronephrosis. Left colonic diverticulosis.  No active diverticulitis. Aortic atherosclerosis, coronary artery disease. Electronically Signed   By: Charlett NoseKevin  Dover M.D.   On: 03/28/2019 00:47    EKG: Independently reviewed. Sinus rhythm, rate 68, QTc 431 ms.    Assessment/Plan   1. Acute pancreatitis  - Presents with one day of severe nausea and epigastric pain, found to have lipase of 1495 and inflammatory changes about the pancreas on CT  - No gall stone visualized on CT, patient denies any EtOH use  - Treated with IVF, analgesia, and antiemetics in ED  - Check RUQ US and triglyceride level, continue bowel-rest, continue IVF hydration, and continue pain-control    2. Type II DM  - No A1c on file  - Managed with Actos and metformin at home, held on admission   - Use SSI with Novolog for now    3. Hypertension  - BP at goal  - Use hydralazine IVP as needed while NPO    4. CAD  - Denies any recent angina  - Resume Plavix and statin when pancreatitis is improving and he is able to tolerate oral medications     PPE: Mask, face shield. Patient wearing mask.  DVT prophylaxis: Lovenox  Code Status: Full  Family Communication: Discussed with patient  Consults called: None Admission status: Observation     Briscoe Deutscherimothy S Wyndham Santilli, MD [facility] Pager [phone]  If 7PM-7AM, please contact night-coverage [email or web address] Password [email or web address]  03/28/2019, 2:19 AM

## 2019-03-28 NOTE — ED Notes (Signed)
C/o increased nausea

## 2019-03-28 NOTE — Progress Notes (Signed)
Pt transported to radiology via wheelchair.

## 2019-03-28 NOTE — ED Notes (Addendum)
ED TO INPATIENT HANDOFF REPORT  ED Nurse Name and Phone #: [phone] Anette Guarneri Name/Age/Gender Adam Kim 73 y.o. male Room/Bed: 016C/016C  Code Status   Code Status: Not on file  Home/SNF/Other Home Patient oriented to: self, place, time and situation Is this baseline? Yes   Triage Complete: Triage complete  Chief Complaint SHOB, Abd pain  Triage Note Pt sent by PCP for RUQ pain with nausea.  Allergies Allergies  Allergen Reactions  . Iodine Other (See Comments)    ask  . Shellfish Allergy Other (See Comments)    ask    Level of Care/Admitting Diagnosis ED Disposition    ED Disposition Condition [facility]: [place] [[id number]]  Level of Care: Med-Surg [16]  I expect the patient will be discharged within 24 hours: Yes  LOW acuity---Tx typically complete <24 hrs---ACUTE conditions typically can be evaluated <24 hours---LABS likely to return to acceptable levels <24 hours---IS near functional baseline---EXPECTED to return to current living arrangement---NOT newly hypoxic: Does not meet criteria for 5C-Observation unit  Covid Evaluation: Screening Protocol (No Symptoms)  Diagnosis: Acute pancreatitis [577.0.ICD-9-CM]  Admitting Physician: Vianne Bulls [[id number]]  Attending Physician: Vianne Bulls [[id number]]  PT Class (Do Not Modify): Observation [104]  PT Acc Code (Do Not Modify): Observation [10022]       B Medical/Surgery History Past Medical History:  Diagnosis Date  . Benign prostatic hyperplasia with lower urinary tract symptoms 10/26/2017  . CAD (coronary artery disease), native coronary artery 10/25/2017  . Chest pain 10/26/2017  . Coronary artery disease   . Gastro-esophageal reflux disease without esophagitis 10/26/2017  . Generalized anxiety disorder 10/26/2017  . Hyperlipidemia   . Hypertension 10/26/2017  . Obesity 10/26/2017  . Obstructive sleep apnea 10/26/2017  . Old myocardial infarct 10/26/2017  .  Type 2 diabetes mellitus ([place]) 10/26/2017  . Vitamin B 12 deficiency 10/26/2017  . Vitamin D deficiency 10/26/2017   Past Surgical History:  Procedure Laterality Date  . CARDIAC CATHETERIZATION    . CORONARY ANGIOPLASTY    . TONSILLECTOMY AND ADENOIDECTOMY       A IV Location/Drains/Wounds Patient Lines/Drains/Airways Status   Active Line/Drains/Airways    Name:   Placement date:   Placement time:   Site:   Days:   Peripheral IV 03/27/19 Right Forearm   03/27/19    2255    Forearm   1          Intake/Output Last 24 hours  Intake/Output Summary (Last 24 hours) at 03/28/2019 0231 Last data filed at 03/28/2019 0114 Gross per 24 hour  Intake 500 ml  Output -  Net 500 ml    Labs/Imaging Results for orders placed or performed during the hospital encounter of 03/27/19 (from the past 48 hour(s))  Lipase, blood     Status: Abnormal   Collection Time: 03/27/19  6:05 PM  Result Value Ref Range   Lipase 1,495 (H) 11 - 51 U/L    Comment: RESULTS CONFIRMED BY MANUAL DILUTION Performed at [facility], [address], [place], [place] [address]   Comprehensive metabolic panel     Status: Abnormal   Collection Time: 03/27/19  6:05 PM  Result Value Ref Range   Sodium 134 (L) 135 - 145 mmol/L   Potassium 4.0 3.5 - 5.1 mmol/L   Chloride 100 98 - 111 mmol/L   CO2 23 22 - 32 mmol/L   Glucose, Bld 193 (H) 70 - 99  mg/dL   BUN 16 8 - 23 mg/dL   Creatinine, Ser 4.091.16 0.61 - 1.24 mg/dL   Calcium 8.8 (L) 8.9 - 10.3 mg/dL   Total Protein 6.3 (L) 6.5 - 8.1 g/dL   Albumin 3.8 3.5 - 5.0 g/dL   AST 49 (H) 15 - 41 U/L   ALT 25 0 - 44 U/L   Alkaline Phosphatase 61 38 - 126 U/L   Total Bilirubin 0.7 0.3 - 1.2 mg/dL   GFR calc non Af Amer >60 >60 mL/min   GFR calc Af Amer >60 >60 mL/min   Anion gap 11 5 - 15    Comment: Performed at [facility], [address] [place], [place] [address]  CBC     Status: Abnormal   Collection Time: 03/27/19  6:05 PM  Result Value Ref Range   WBC  11.0 (H) 4.0 - 10.5 K/uL   RBC 4.17 (L) 4.22 - 5.81 MIL/uL   Hemoglobin 12.3 (L) 13.0 - 17.0 g/dL   HCT 47.839.2 29.539.0 - 62.152.0 %   MCV 94.0 80.0 - 100.0 fL   MCH 29.5 26.0 - 34.0 pg   MCHC 31.4 30.0 - 36.0 g/dL   RDW 30.813.9 65.711.5 - 84.615.5 %   Platelets 235 150 - 400 K/uL   nRBC 0.0 0.0 - 0.2 %    Comment: Performed at [facility], [address] [place], [place] [address]  Troponin I (High Sensitivity)     Status: None   Collection Time: 03/27/19  6:05 PM  Result Value Ref Range   Troponin I (High Sensitivity) 4 <18 ng/L    Comment: (NOTE) Elevated high sensitivity troponin I (hsTnI) values and significant  changes across serial measurements may suggest ACS but many other  chronic and acute conditions are known to elevate hsTnI results.  Refer to the Links section for chest pain algorithms and additional  guidance. Performed at [facility], [address] [place], [place] [address]   CBG monitoring, ED     Status: Abnormal   Collection Time: 03/27/19  8:28 PM  Result Value Ref Range   Glucose-Capillary 193 (H) 70 - 99 mg/dL  Troponin I (High Sensitivity)     Status: None   Collection Time: 03/27/19 10:53 PM  Result Value Ref Range   Troponin I (High Sensitivity) 5 <18 ng/L    Comment: (NOTE) Elevated high sensitivity troponin I (hsTnI) values and significant  changes across serial measurements may suggest ACS but many other  chronic and acute conditions are known to elevate hsTnI results.  Refer to the "Links" section for chest pain algorithms and additional  guidance. Performed at [facility], [address] [place], [place] [address]    Ct Abdomen Pelvis Wo Contrast  Result Date: 03/28/2019 CLINICAL DATA:  Abdominal pain. Pancreatitis. Evaluate for choledocholithiasis EXAM: CT ABDOMEN AND PELVIS WITHOUT CONTRAST TECHNIQUE: Multidetector CT imaging of the abdomen and pelvis was performed following the standard protocol without IV contrast. COMPARISON:  None.  FINDINGS: Lower chest: Lung bases are clear. No effusions. Heart is normal size. Coronary artery calcifications noted. Hepatobiliary: No focal hepatic abnormality. Gallbladder unremarkable. No visible gallstones. Pancreas: Stranding around the pancreas compatible with acute pancreatitis. No focal abnormality or ductal dilatation. Mild fatty replacement. Spleen: No focal abnormality.  Normal size. Adrenals/Urinary Tract: Nonobstructing stone in the midpole of the left kidney. No hydronephrosis. No renal or adrenal mass. Urinary bladder unremarkable. Stomach/Bowel: Left colonic diverticulosis. No active diverticulitis.  Appendix is normal. Small duodenal diverticulum. Stomach is decompressed. No evidence of bowel obstruction. Vascular/Lymphatic: No evidence of aneurysm or adenopathy. Aortic atherosclerosis. Reproductive: No visible focal abnormality. Other: No free fluid or free air. Musculoskeletal: No acute bony abnormality. IMPRESSION: Stranding around the pancreas compatible with acute pancreatitis. No visible gallstones. Left nephrolithiasis.  No hydronephrosis. Left colonic diverticulosis.  No active diverticulitis. Aortic atherosclerosis, coronary artery disease. Electronically Signed   By: Charlett NoseKevin  Dover M.D.   On: 03/28/2019 00:47    Pending Labs Unresulted Labs (From admission, onward)    Start     Ordered   03/28/19 0025  SARS Coronavirus 2 (CEPHEID - Performed in [facility] hospital lab), Hosp Order  (Asymptomatic Patients Labs)  Once,   STAT    Question:  Rule Out  Answer:  Yes   03/28/19 0024   03/27/19 1803  Urinalysis, Routine w reflex microscopic  ONCE - STAT,   STAT     03/27/19 1802   Signed and Held  Creatinine, serum  (enoxaparin (LOVENOX)    CrCl >/= 30 ml/min)  Weekly,   R    Comments: while on enoxaparin therapy    Signed and Held   Signed and Held  Comprehensive metabolic panel  Tomorrow morning,   R     Signed and Held   Signed and Held  CBC WITH DIFFERENTIAL  Tomorrow morning,    R     Signed and Held   Signed and Held  Lipase, blood  Daily,   R     Signed and Held   Signed and Held  Triglycerides  Once,   R     Signed and Held          Vitals/Pain Today's Vitals   03/27/19 2345 03/28/19 0000 03/28/19 0100 03/28/19 0145  BP: (!) 147/55 (!) 134/52 (!) 113/47 130/63  Pulse: 88 75 81 77  Resp: (!) 27 (!) 23 18 (!) 21  Temp:      TempSrc:      SpO2: 99% 98% 93% 91%  PainSc:        Isolation Precautions No active isolations  Medications Medications  0.9 %  sodium chloride infusion (has no administration in time range)  ondansetron (ZOFRAN) tablet 4 mg (has no administration in time range)    Or  ondansetron (ZOFRAN) injection 4 mg (has no administration in time range)  HYDROmorphone (DILAUDID) injection 0.5-1 mg (has no administration in time range)  hydrALAZINE (APRESOLINE) injection 10 mg (has no administration in time range)  sodium chloride flush (NS) 0.9 % injection 3 mL (3 mLs Intravenous Given 03/28/19 0001)  ondansetron (ZOFRAN-ODT) disintegrating tablet 4 mg (4 mg Oral Given 03/27/19 2026)  sodium chloride 0.9 % bolus 500 mL (0 mLs Intravenous Stopped 03/28/19 0114)  ondansetron (ZOFRAN) injection 4 mg (4 mg Intravenous Given 03/27/19 2359)  HYDROmorphone (DILAUDID) injection 1 mg (1 mg Intravenous Given 03/27/19 2359)  metoCLOPramide (REGLAN) injection 10 mg (10 mg Intravenous Given 03/28/19 0153)    Mobility walks     Focused Assessments    R Recommendations: See Admitting Provider Note  Report given to:   Additional Notes:

## 2019-03-29 ENCOUNTER — Encounter (HOSPITAL_COMMUNITY): Payer: Self-pay | Admitting: Physician Assistant

## 2019-03-29 DIAGNOSIS — R1010 Upper abdominal pain, unspecified: Secondary | ICD-10-CM

## 2019-03-29 DIAGNOSIS — R945 Abnormal results of liver function studies: Secondary | ICD-10-CM

## 2019-03-29 DIAGNOSIS — R110 Nausea: Secondary | ICD-10-CM

## 2019-03-29 DIAGNOSIS — K859 Acute pancreatitis without necrosis or infection, unspecified: Principal | ICD-10-CM

## 2019-03-29 LAB — COMPREHENSIVE METABOLIC PANEL
ALT: 34 U/L (ref 0–44)
AST: 43 U/L — ABNORMAL HIGH (ref 15–41)
Albumin: 3.3 g/dL — ABNORMAL LOW (ref 3.5–5.0)
Alkaline Phosphatase: 54 U/L (ref 38–126)
Anion gap: 10 (ref 5–15)
BUN: 10 mg/dL (ref 8–23)
CO2: 26 mmol/L (ref 22–32)
Calcium: 8.1 mg/dL — ABNORMAL LOW (ref 8.9–10.3)
Chloride: 100 mmol/L (ref 98–111)
Creatinine, Ser: 1.1 mg/dL (ref 0.61–1.24)
GFR calc Af Amer: >60 mL/min (ref >60)
GFR calc non Af Amer: >60 mL/min (ref >60)
Glucose, Bld: 142 mg/dL — ABNORMAL HIGH (ref 70–99)
Potassium: 4 mmol/L (ref 3.5–5.1)
Sodium: 136 mmol/L (ref 135–145)
Total Bilirubin: 1 mg/dL (ref 0.3–1.2)
Total Protein: 5.7 g/dL — ABNORMAL LOW (ref 6.5–8.1)

## 2019-03-29 LAB — GLUCOSE, CAPILLARY
Glucose-Capillary: 117 mg/dL — ABNORMAL HIGH (ref 70–99)
Glucose-Capillary: 132 mg/dL — ABNORMAL HIGH (ref 70–99)
Glucose-Capillary: 138 mg/dL — ABNORMAL HIGH (ref 70–99)
Glucose-Capillary: 150 mg/dL — ABNORMAL HIGH (ref 70–99)
Glucose-Capillary: 153 mg/dL — ABNORMAL HIGH (ref 70–99)
Glucose-Capillary: 161 mg/dL — ABNORMAL HIGH (ref 70–99)

## 2019-03-29 LAB — CBC WITH DIFFERENTIAL/PLATELET
Abs Immature Granulocytes: 0.08 10*3/uL — ABNORMAL HIGH (ref 0.00–0.07)
Basophils Absolute: 0 10*3/uL (ref 0.0–0.1)
Basophils Relative: 0 %
Eosinophils Absolute: 0.1 10*3/uL (ref 0.0–0.5)
Eosinophils Relative: 1 %
HCT: 34.2 % — ABNORMAL LOW (ref 39.0–52.0)
Hemoglobin: 11.1 g/dL — ABNORMAL LOW (ref 13.0–17.0)
Immature Granulocytes: 1 %
Lymphocytes Relative: 9 %
Lymphs Abs: 1.2 10*3/uL (ref 0.7–4.0)
MCH: 29.8 pg (ref 26.0–34.0)
MCHC: 32.5 g/dL (ref 30.0–36.0)
MCV: 91.7 fL (ref 80.0–100.0)
Monocytes Absolute: 1.6 10*3/uL — ABNORMAL HIGH (ref 0.1–1.0)
Monocytes Relative: 12 %
Neutro Abs: 10.1 10*3/uL — ABNORMAL HIGH (ref 1.7–7.7)
Neutrophils Relative %: 77 %
Platelets: 190 10*3/uL (ref 150–400)
RBC: 3.73 MIL/uL — ABNORMAL LOW (ref 4.22–5.81)
RDW: 14.3 % (ref 11.5–15.5)
WBC: 13.2 10*3/uL — ABNORMAL HIGH (ref 4.0–10.5)
nRBC: 0 % (ref 0.0–0.2)

## 2019-03-29 LAB — LIPASE, BLOOD: Lipase: 82 U/L — ABNORMAL HIGH (ref 11–51)

## 2019-03-29 MED ORDER — HYDROCODONE-ACETAMINOPHEN 5-325 MG PO TABS
1.0000 | ORAL_TABLET | ORAL | Status: DC | PRN
  Administered 2019-04-01 (×2): 1 via ORAL
  Administered 2019-04-02: 2 via ORAL
  Filled 2019-03-29: qty 2
  Filled 2019-03-29: qty 1
  Filled 2019-03-29: qty 2
  Filled 2019-03-29: qty 1

## 2019-03-29 MED ORDER — SODIUM CHLORIDE 0.9 % IV SOLN
INTRAVENOUS | Status: AC
  Administered 2019-03-29 – 2019-03-31 (×8): via INTRAVENOUS

## 2019-03-29 MED ORDER — BISACODYL 10 MG RE SUPP
10.0000 mg | Freq: Once | RECTAL | Status: AC
  Administered 2019-03-29: 10 mg via RECTAL
  Filled 2019-03-29: qty 1

## 2019-03-29 MED ORDER — METOCLOPRAMIDE HCL 5 MG/ML IJ SOLN
10.0000 mg | Freq: Four times a day (QID) | INTRAMUSCULAR | Status: AC
  Administered 2019-03-29 (×2): 10 mg via INTRAVENOUS
  Filled 2019-03-29 (×2): qty 2

## 2019-03-29 MED ORDER — PANTOPRAZOLE SODIUM 40 MG IV SOLR
40.0000 mg | Freq: Once | INTRAVENOUS | Status: AC
  Administered 2019-03-29: 40 mg via INTRAVENOUS
  Filled 2019-03-29: qty 40

## 2019-03-29 MED ORDER — PROCHLORPERAZINE EDISYLATE 10 MG/2ML IJ SOLN
10.0000 mg | INTRAMUSCULAR | Status: DC | PRN
  Administered 2019-03-29 – 2019-04-02 (×9): 10 mg via INTRAVENOUS
  Filled 2019-03-29 (×10): qty 2

## 2019-03-29 MED ORDER — PANTOPRAZOLE SODIUM 40 MG PO TBEC
40.0000 mg | DELAYED_RELEASE_TABLET | Freq: Every day | ORAL | Status: DC
  Administered 2019-03-30 – 2019-04-02 (×4): 40 mg via ORAL
  Filled 2019-03-29 (×4): qty 1

## 2019-03-29 MED ORDER — DOCUSATE SODIUM 100 MG PO CAPS
200.0000 mg | ORAL_CAPSULE | Freq: Once | ORAL | Status: AC
  Administered 2019-03-29: 200 mg via ORAL
  Filled 2019-03-29: qty 2

## 2019-03-29 NOTE — Progress Notes (Signed)
Pt continues to c./o nausea. No acute distress noted. Pt reports he has had a bowel movement.

## 2019-03-29 NOTE — Consult Note (Addendum)
[facility] Consult: 9:26 AM 03/29/2019  LOS: 1 day    Referring Provider: Dr Benny Lennert  Primary Care Physician:  Raelyn Number, MD Primary Gastroenterologist:  Dr. Lyndel Safe     Reason for Consultation:  Acute pancreatitis   HPI: Adam Kim is a 73 y.o. male.  PMH obesity.  OSA.  CAD. S/p angioplasty, on Plavix.  DM 2, on actos, metformin.  Htn. HLD.  GERD.  Blood loss anemia. Treated many years ago for H Pylori, per pt report but not mentioned in Dr Steve Rattler office notes.  Colon polyps.  Colon diverticulosis.    Colon polyps in 2002 and 2006: HP and sessile serrated in 2006.   Pan diverticulosis, int hemorrhoids on 2009 colonoscopy 2006 EGD normal with path report of non-specific, chonic gastric inflamation, no HGD, no H pylori.   Pt did not follow through on colonoscopy set for 09/2013  Ate breakfast of sausage biscuit, lunch of cereal/milk.  Laid down ~ 1 PM and onset pain across upper abdomen bil, nausea but no emesis.  Went to [facility] and sent to ED.   Lipase 1495 >> 582 >> 82.   T bili max 1.3.  Alk phos normal.  AST/ALT 49/25 >> 43/34.   Trigs 74.  WBCs 13.2.   Calcium 8.1, but corrected level for albumin 3.3 is  CTAP with contrast Uncomplicated peripancreatic stranding.  No gallstones.  Liver and bile ducts unremarkable.   Ultrasound: sensitivity degraded by body habitus.  4 mm CBD.  Unremarkable GB, liver.  PV with normal dopplers.  No ETOH.  No new meds, all current meds present for years.  [facility] stopped Ranitidine ~ 4 months ago due to cancer link.  Pt did not notice consequent increase of reflux sxs, generally does not have GI issues.  Eats poorly, fatty and starchy foods are common.  20# wt increase since 11/2018 when he stopped going out due to Covid sheltering.    Today pain  present but bigger issue is nausea, bloating.  Last BM was yest AM.   Plavix is not on hold.     No fm hx of pancreatitis, GB issues.     Past Medical History:  Diagnosis Date  . Benign prostatic hyperplasia with lower urinary tract symptoms 10/26/2017  . CAD (coronary artery disease), native coronary artery 10/25/2017  . Chest pain 10/26/2017  . Gastro-esophageal reflux disease without esophagitis 10/26/2017  . Generalized anxiety disorder 10/26/2017  . Hyperlipidemia   . Hypertension 10/26/2017  . Obesity 10/26/2017  . Obstructive sleep apnea 10/26/2017  . Old myocardial infarct 10/26/2017  . Type 2 diabetes mellitus ([place]) 10/26/2017  . Vitamin B 12 deficiency 10/26/2017  . Vitamin D deficiency 10/26/2017    Past Surgical History:  Procedure Laterality Date  . CARDIAC CATHETERIZATION    . CORONARY ANGIOPLASTY    . TONSILLECTOMY AND ADENOIDECTOMY      Prior to Admission medications   Medication Sig Start Date End Date Taking? Authorizing Provider  atorvastatin (LIPITOR) 10 MG tablet Take 10 mg by mouth daily  at 6 PM.    Yes [provider]  CALCIUM-MAGNESIUM-ZINC PO Take 1 tablet by mouth daily.   Yes [provider]  candesartan (ATACAND) 4 MG tablet Take 4 mg by mouth daily.   Yes [provider]  clopidogrel (PLAVIX) 75 MG tablet Take 75 mg by mouth daily.   Yes [provider]  Melatonin 5 MG TABS Take 5 mg by mouth at bedtime.   Yes [provider]  metFORMIN (GLUCOPHAGE) 500 MG tablet Take 500-1,000 mg by mouth See admin instructions. Takes 2 tablets every morning, take 1 tablet at noon, and 2 tablets in the evening   Yes [provider]  metoprolol succinate (TOPROL-XL) 25 MG 24 hr tablet Take 25 mg by mouth daily.   Yes [provider]  montelukast (SINGULAIR) 10 MG tablet Take 10 mg by mouth at bedtime.   Yes [provider]  pioglitazone (ACTOS) 45 MG tablet Take 45 mg by mouth daily.   Yes [provider]  potassium chloride (MICRO-K) 10 MEQ CR capsule Take 10 mEq by mouth daily.   Yes [provider]  vitamin E 400 UNIT capsule Take 400 Units by mouth daily.   Yes [provider]    Scheduled Meds: . clopidogrel  75 mg Oral Daily  . enoxaparin (LOVENOX) injection  40 mg Subcutaneous Q24H  . insulin aspart  0-9 Units Subcutaneous Q4H  . metoprolol succinate  25 mg Oral Daily   Infusions:  PRN Meds: acetaminophen **OR** acetaminophen, hydrALAZINE, HYDROmorphone (DILAUDID) injection, ondansetron **OR** ondansetron (ZOFRAN) IV, simethicone   Allergies as of 03/27/2019 - Review Complete 03/27/2019  Allergen Reaction Noted  . Iodine Other (See Comments) 11/09/2016  . Shellfish allergy Other (See Comments) 11/09/2016    Family History  Problem Relation Age of Onset  . Diabetes Mother     Social History   Socioeconomic History  . Marital status: Married    Spouse name: Not on file  . Number of children: Not on file  . Years of education: Not on file  . Highest education level: Not on file  Occupational History  . Not on file  Social Needs  . Financial resource strain: Not on file  . Food insecurity    Worry: Not on file    Inability: Not on file  . Transportation needs    Medical: Not on file    Non-medical: Not on file  Tobacco Use  . Smoking status: Never Smoker  . Smokeless tobacco: Never Used  Substance and Sexual Activity  . Alcohol use: No    Frequency: Never  . Drug use: No  . Sexual activity: Not on file  Lifestyle  . Physical activity    Days per week: Not on file    Minutes per session: Not on file  . Stress: Not on file  Relationships  . Social Herbalist on phone: Not on file    Gets together: Not on file    Attends religious service: Not on file    Active member of club or organization: Not on file    Attends meetings of clubs or organizations: Not on file    Relationship status: Not on file  . Intimate  partner violence    Fear of current or ex partner: Not on file    Emotionally abused: Not on file    Physically abused: Not on file    Forced sexual activity: Not on file  Other Topics Concern  .  Not on file  Social History Narrative  . Not on file    REVIEW OF SYSTEMS: Constitutional: No fatigue or weakness.  Fairly interactive, no regular exercise. ENT:  No nose bleeds Pulm: Denies shortness of breath, denies cough. CV:  No palpitations, no LE edema, no angina.  GU:  No hematuria, no frequency GI: See HPI. Heme: Denies unusual bleeding or bruising. Transfusions: Previous blood product transfusions. Neuro:  No headaches, no peripheral tingling or numbness.  No syncope.  No seizures. Derm:  No itching, no rash or sores.   Endocrine:  No sweats or chills.  No polyuria or dysuria Immunization: Not queried. Travel:  None in last several months.    PHYSICAL EXAM: Vital signs in last 24 hours: Vitals:   03/28/19 2012 03/29/19 0330  BP: (!) 122/58 (!) 139/57  Pulse: 79 91  Resp:  14  Temp: 99.7 F (37.6 C) 98.1 F (36.7 C)  SpO2: 97% 93%   Wt Readings from Last 3 Encounters:  03/28/19 120 kg  12/20/17 119.8 kg  12/08/17 119.3 kg    General: Obese, uncomfortable, nontoxic, unhealthy but not acutely ill looking WM. Head: Nines of head trauma.  No facial asymmetry or swelling. Eyes: No scleral icterus.  No conjunctival pallor.  EOMI. Ears: Not hard of hearing. Nose: No congestion, no discharge. Mouth: Tongue midline.  Good dentition.  Oral mucosa moist, pink, clear. Neck: No JVD, no masses, no thyromegaly. Lungs: Clear bilaterally.  No labored breathing or cough. Heart: RRR.  No MRG.  S1, S2 present. Abdomen: Obese, soft.  Bowel sounds active.  Mild tenderness across upper abdomen bilaterally without guarding or rebound..   Rectal: Deferred. Musc/Skeltl: No joint redness, swelling, gross deformity. Extremities: No CCE, no mottling. Neurologic: Alert.  Oriented x3.   Moves all 4 limbs with strength intact.  No tremors. Skin: No rash, sores, telangiectasia Tattoos: None Nodes: No cervical adenopathy. Psych: Cooperative, slightly anxious, fluid speech.  Intake/Output from previous day: 06/30 0701 - 07/01 0700 In: 240 [P.O.:240] Out: -  Intake/Output this shift: No intake/output data recorded.  LAB RESULTS: Recent Labs    03/27/19 1805 03/28/19 0525 03/29/19 0326  WBC 11.0* 10.1 13.2*  HGB 12.3* 11.6* 11.1*  HCT 39.2 35.6* 34.2*  PLT 235 237 190   BMET Lab Results  Component Value Date   NA 136 03/29/2019   NA 135 03/28/2019   NA 134 (L) 03/27/2019   K 4.0 03/29/2019   K 4.8 03/28/2019   K 4.0 03/27/2019   CL 100 03/29/2019   CL 101 03/28/2019   CL 100 03/27/2019   CO2 26 03/29/2019   CO2 26 03/28/2019   CO2 23 03/27/2019   GLUCOSE 142 (H) 03/29/2019   GLUCOSE 193 (H) 03/28/2019   GLUCOSE 193 (H) 03/27/2019   BUN 10 03/29/2019   BUN 15 03/28/2019   BUN 16 03/27/2019   CREATININE 1.10 03/29/2019   CREATININE 1.04 03/28/2019   CREATININE 1.16 03/27/2019   CALCIUM 8.1 (L) 03/29/2019   CALCIUM 8.2 (L) 03/28/2019   CALCIUM 8.8 (L) 03/27/2019   LFT Recent Labs    03/27/19 1805 03/28/19 0525 03/29/19 0326  PROT 6.3* 5.8* 5.7*  ALBUMIN 3.8 3.5 3.3*  AST 49* 41 43*  ALT 25 24 34  ALKPHOS 61 48 54  BILITOT 0.7 1.3* 1.0   PT/INR No results found for: INR, PROTIME Hepatitis Panel No results for input(s): HEPBSAG, HCVAB, HEPAIGM, HEPBIGM in the last 72 hours. C-Diff No components found for: CDIFF  Lipase     Component Value Date/Time   LIPASE 82 (H) 03/29/2019 0326    Drugs of Abuse  No results found for: LABOPIA, COCAINSCRNUR, LABBENZ, AMPHETMU, THCU, LABBARB   RADIOLOGY STUDIES: Ct Abdomen Pelvis Wo Contrast  Result Date: 03/28/2019 CLINICAL DATA:  Abdominal pain. Pancreatitis. Evaluate for choledocholithiasis EXAM: CT ABDOMEN AND PELVIS WITHOUT CONTRAST TECHNIQUE: Multidetector CT imaging of the abdomen and  pelvis was performed following the standard protocol without IV contrast. COMPARISON:  None. FINDINGS: Lower chest: Lung bases are clear. No effusions. Heart is normal size. Coronary artery calcifications noted. Hepatobiliary: No focal hepatic abnormality. Gallbladder unremarkable. No visible gallstones. Pancreas: Stranding around the pancreas compatible with acute pancreatitis. No focal abnormality or ductal dilatation. Mild fatty replacement. Spleen: No focal abnormality.  Normal size. Adrenals/Urinary Tract: Nonobstructing stone in the midpole of the left kidney. No hydronephrosis. No renal or adrenal mass. Urinary bladder unremarkable. Stomach/Bowel: Left colonic diverticulosis. No active diverticulitis. Appendix is normal. Small duodenal diverticulum. Stomach is decompressed. No evidence of bowel obstruction. Vascular/Lymphatic: No evidence of aneurysm or adenopathy. Aortic atherosclerosis. Reproductive: No visible focal abnormality. Other: No free fluid or free air. Musculoskeletal: No acute bony abnormality. IMPRESSION: Stranding around the pancreas compatible with acute pancreatitis. No visible gallstones. Left nephrolithiasis.  No hydronephrosis. Left colonic diverticulosis.  No active diverticulitis. Aortic atherosclerosis, coronary artery disease. Electronically Signed   By: Rolm Baptise M.D.   On: 03/28/2019 00:47   US Abdomen Limited Ruq  Result Date: 03/28/2019 CLINICAL DATA:  Acute pancreatitis EXAM: ULTRASOUND ABDOMEN LIMITED RIGHT UPPER QUADRANT COMPARISON:  CT from earlier today FINDINGS: Gallbladder: No gallstones or wall thickening visualized. No sonographic Murphy sign noted by sonographer. Common bile duct: Diameter: 4 mm.  Where visualized, no filling defect. Liver: No focal lesion identified. Within normal limits in parenchymal echogenicity. Portal vein is patent on color Doppler imaging with normal direction of blood flow towards the liver. Sensitivity is degraded by body habitus.  IMPRESSION: No acute finding or biliary calculus. Electronically Signed   By: Monte Fantasia M.D.   On: 03/28/2019 05:21     IMPRESSION:   *   Acute pancreatitis.   Patient does not consume alcoholic beverages.  Triglycerides normal.  No evidence of gallstones or biliary ductal abnormalities on CT or ultrasound.   ? Due to Lipitor?, though taking this since before 2006.    *   DM2.    *   CAD.  Previous stent.  Chronic Plavix not on hold.   *   Sessile serrated colon polyps 2006, no polyps 2009.  Never followed through with planned surveillance colonoscopy planned for early 2015 so surveillance is overdue.    *   Hx GERD.  No on PPI or H2 blocker at home.  Few sxs at home.      PLAN:     *   Supportive care.    *  Added NS @ 150/hour since no current IVF (bolused 567m 6/29, NS @ 100/hour dcd 12979yesterday).  Added 3 doses Reglan IV q 6.  Added Protonix, IV now, po tmrw.    *   Continue clears as tolerated.    *   Strict Is/Os.  No need for daily Lipase, so dc'd order.  BMET, CBC in AM.     SAzucena Freed 03/29/2019, 9:26 AM Phone [phone]  I have reviewed the entire case in detail with the above APP and discussed the plan in detail.  Therefore, I agree  with the diagnoses recorded above. In addition,  I have personally interviewed and examined the patient and have personally reviewed any abdominal/pelvic CT scan images.  My additional thoughts are as follows:  This is his first episode of acute pancreatitis, and it is of unknown cause.  He has upper abdominal pain and nausea, for which we have added scheduled dose Reglan.  IV fluids were increased today since his p.o. intake has been poor.  He had a small bowel movement earlier today, abdomen is soft mildly tympanitic with good bowel sounds, so no ileus at present.  Mild AST predominant transaminitis, unchanged of admission, does not likely represent biliary stone or sludge as cause for pancreatitis. [place] images limited by  body habitus, but no sludge/stones noted.  Clinically, he does not have cholecystitis, so I have canceled the HIDA scan.  Supportive care with the measures noted above, and we will follow.  Nelida Meuse III [phone]

## 2019-03-29 NOTE — Plan of Care (Signed)
  Problem: Clinical Measurements: Goal: Will remain free from infection Outcome: Progressing   Problem: Clinical Measurements: Goal: Diagnostic test results will improve Outcome: Progressing   Problem: Clinical Measurements: Goal: Ability to maintain clinical measurements within normal limits will improve Outcome: Progressing   

## 2019-03-29 NOTE — Progress Notes (Signed)
RT placed patient on home cpap with 2l O2 bled into circuit. Patient is tolerating CPAP well at this time. RT will monitor as needed.

## 2019-03-29 NOTE — Progress Notes (Signed)
PROGRESS NOTE  Adam ReefBilly Mclean ZOX:[id number] DOB: 08/18/1946 DOA: 03/27/2019 PCP: Shelbie AmmonsHaque, Imran P, MD  Brief History    Adam Kim is a 73 y.o. male with medical history significant for type 2 diabetes mellitus, hypertension, coronary artery disease, and OSA, now presenting to the emergency department for evaluation of nausea and epigastric abdominal pain.  Patient reports that he was in his usual state of health and had an uneventful morning, but developed nausea after lunch that progressively worsened, but without much vomiting.  He went on to develop sharp pain in the epigastrium that was waxing and waning, severe at times, and localized.  He had a small bowel movement earlier in the day.  Denies any recent fevers or chills, has not been coughing, and denies any chest pain.  Patient reports experiencing similar pain several years ago and states that he was treated with Nexium and treated for H. pylori at that time, and the symptoms completely resolved.  Reports that he recently stopped taking ranitidine but has not started any new medications.  ED Course: Upon arrival to the ED, patient is found to be afebrile, saturating adequately on room air, and with remaining vitals also stable.  EKG features normal sinus rhythm and CT the abdomen and pelvis demonstrates stranding about the pancreas consistent with acute pancreatitis, but no visible cholelithiasis.  Chemistry panel features a slight hyponatremia, slight elevation in AST, normal bilirubin, and lipase elevated 1495.  CBC features mild leukocytosis and troponin is negative x2.  Patient was given 500 cc normal saline, Reglan, Zofran, and Dilaudid in the ED.  He is improved but remains uncomfortable despite all of this and hospitalists are asked to admit.  The patient was admitted to a medical bed. He is on a clear liquid diet, but doesn't feel much like eating. Consultants  . None  Procedures  . None  Antibiotics   Anti-infectives (From  admission, onward)   None     .   Subjective  The patient is resting comfortably. He states that his abdominal pain is waning, but he is very nauseated this morning.  Objective   Vitals:  Vitals:   03/29/19 0330 03/29/19 1559  BP: (!) 139/57 (!) 116/55  Pulse: 91 91  Resp: 14   Temp: 98.1 F (36.7 C) 98.2 F (36.8 C)  SpO2: 93% 96%    Exam:  Constitutional:  . The patient is awake, alert, and oriented. He is in moderate distress from nausea. Respiratory:  . No increased work of breathing. . No wheezes, rales, or rhonchi. . No tactile fremitus. Cardiovascular:  . Regular rate and rhythm. . No murmurs, ectopy, or gallups . No lateral PMI. No thrills. Abdomen:  . Abdomen is obese. Abdomen feels more distended this morning. Less tender in epigastrum. . No hernias, masses, or organomegaly. . Hypoactive bowel sounds. Musculoskeletal:  . No cyanosis, clubbing, or edema Skin:  . No rashes, lesions, ulcers . palpation of skin: no induration or nodules Neurologic:  . CN 2-12 intact . Sensation all 4 extremities intact Psychiatric:  . Mental status o Mood, affect appropriate o Orientation to person, place, time  . judgment and insight appear intact   I have personally reviewed the following:   Today's Data  . CBC, Lipase, CMP.  .  Imaging  Right upper quadrant ultrasound: No acute finding or biliary calculus  Scheduled Meds: . clopidogrel  75 mg Oral Daily  . enoxaparin (LOVENOX) injection  40 mg Subcutaneous Q24H  . insulin aspart  0-9 Units Subcutaneous Q4H  . metoCLOPramide (REGLAN) injection  10 mg Intravenous Q6H  . metoprolol succinate  25 mg Oral Daily  . [START ON 03/30/2019] pantoprazole  40 mg Oral Daily   Continuous Infusions: . sodium chloride 150 mL/hr at 03/29/19 1138    Principal Problem:   Acute pancreatitis Active Problems:   CAD (coronary artery disease), native coronary artery   Type 2 diabetes mellitus ([facility])   Hypertension    Obstructive sleep apnea   LOS: 1 day   A & P  Acute pancreatitis: Presents with one day of severe nausea and epigastric pain, found to have lipase of 1495 and inflammatory changes about the pancreas on CT. RUQ ultrasound and CT negative for visualized calculus or evidence of biliary disease. The patient has initially been kept NPO with pain control and antiemetics and IVF. This morning he is still having pain, but is interested in clear liquids. Will start these and advance as tolerated. Consider HIDA scan if patient remains symptomatic in the am. Triglycerides are unremarkable and the patient denies family history or alcohol use. Among PTA meds is lipitor which may be a cause for pancreatitis. This is being held. Gastroenterology has been consulted as per the patient's request. Lipase is down to 81. I will order a HIDA scan for tomorrow as the patient and his wife are very concerned about his gallbladder.  Type II DM: No A1c on file. I will order one. Actos and metformin have been held. The patient's glucoses are being managed with QAC and HS FSBS and SSI.  Hypertension: Blood pressures are under fair control. Atacand and metoprolol are being held. Use hydralazine IVP as needed while NPO    CAD: Noted. Stable. Resume Plavix. Continue to hold statin as it may be a cause for pancreatitis.  I have seen and examined this patient myself. I have spent 44 minutes in his evaluation and care. I have spent more than 50% of this time in consultation with gastroenterology and in counseling with the patient and his wife regarding the possible etiology of his pancreatitis and his workup.   DVT prophylaxis: Lovenox  Code Status: Full  Family Communication: I have discussed the patient with his wife this morning. All questions answered to the best of my ability. Disposition:Home  Marget Outten, DO [facility] Direct contact: see [email or web address]  7PM-7AM contact night coverage as above  03/29/2019,  4:18 PM  LOS: 0 days        d

## 2019-03-29 NOTE — Plan of Care (Signed)

## 2019-03-30 DIAGNOSIS — R740 Nonspecific elevation of levels of transaminase and lactic acid dehydrogenase [LDH]: Secondary | ICD-10-CM

## 2019-03-30 LAB — CBC
HCT: 34.3 % — ABNORMAL LOW (ref 39.0–52.0)
Hemoglobin: 10.9 g/dL — ABNORMAL LOW (ref 13.0–17.0)
MCH: 29.5 pg (ref 26.0–34.0)
MCHC: 31.8 g/dL (ref 30.0–36.0)
MCV: 92.7 fL (ref 80.0–100.0)
Platelets: 178 10*3/uL (ref 150–400)
RBC: 3.7 MIL/uL — ABNORMAL LOW (ref 4.22–5.81)
RDW: 14.1 % (ref 11.5–15.5)
WBC: 14.9 10*3/uL — ABNORMAL HIGH (ref 4.0–10.5)
nRBC: 0.1 % (ref 0.0–0.2)

## 2019-03-30 LAB — GLUCOSE, CAPILLARY
Glucose-Capillary: 158 mg/dL — ABNORMAL HIGH (ref 70–99)
Glucose-Capillary: 139 mg/dL — ABNORMAL HIGH (ref 70–99)
Glucose-Capillary: 122 mg/dL — ABNORMAL HIGH (ref 70–99)
Glucose-Capillary: 133 mg/dL — ABNORMAL HIGH (ref 70–99)
Glucose-Capillary: 139 mg/dL — ABNORMAL HIGH (ref 70–99)

## 2019-03-30 LAB — BASIC METABOLIC PANEL
Anion gap: 11 (ref 5–15)
BUN: 11 mg/dL (ref 8–23)
CO2: 24 mmol/L (ref 22–32)
Calcium: 7.5 mg/dL — ABNORMAL LOW (ref 8.9–10.3)
Chloride: 100 mmol/L (ref 98–111)
Creatinine, Ser: 1.08 mg/dL (ref 0.61–1.24)
GFR calc Af Amer: >60 mL/min (ref >60)
GFR calc non Af Amer: >60 mL/min (ref >60)
Glucose, Bld: 149 mg/dL — ABNORMAL HIGH (ref 70–99)
Potassium: 4.1 mmol/L (ref 3.5–5.1)
Sodium: 135 mmol/L (ref 135–145)

## 2019-03-30 NOTE — Progress Notes (Signed)
[facility] Progress Note  Chief Complaint: Acute pancreatitis  History:  Chawn continues to have upper abdominal pain, but says it is decreased from yesterday.  His nausea has also subsided on current therapy.  He passed some gas earlier today but no bowel movement since early yesterday (not eaten in a few days). No reported fever or vomiting. He was able to tolerate clear liquids earlier today.  ROS: Cardiovascular: Denies chest pain Respiratory: Denies dyspnea Urinary: Denies dysuria or hematuria  Objective:   Current Facility-Administered Medications:  .  0.9 %  sodium chloride infusion, , Intravenous, Continuous, Vena Rua, PA-C, Last Rate: 150 mL/hr at 03/30/19 0654 .  acetaminophen (TYLENOL) tablet 650 mg, 650 mg, Oral, Q6H PRN **OR** acetaminophen (TYLENOL) suppository 650 mg, 650 mg, Rectal, Q6H PRN, Opyd, Timothy S, MD .  clopidogrel (PLAVIX) tablet 75 mg, 75 mg, Oral, Daily, Swayze, Ava, DO, 75 mg at 03/30/19 0845 .  enoxaparin (LOVENOX) injection 40 mg, 40 mg, Subcutaneous, Q24H, Opyd, Ilene Qua, MD, 40 mg at 03/30/19 0846 .  hydrALAZINE (APRESOLINE) injection 10 mg, 10 mg, Intravenous, Q4H PRN, Opyd, Timothy S, MD .  HYDROcodone-acetaminophen (NORCO/VICODIN) 5-325 MG per tablet 1-2 tablet, 1-2 tablet, Oral, Q4H PRN, Swayze, Ava, DO .  HYDROmorphone (DILAUDID) injection 0.5-1 mg, 0.5-1 mg, Intravenous, Q4H PRN, Opyd, Ilene Qua, MD, 1 mg at 03/30/19 0912 .  insulin aspart (novoLOG) injection 0-9 Units, 0-9 Units, Subcutaneous, Q4H, Opyd, Ilene Qua, MD, 1 Units at 03/30/19 0846 .  metoprolol succinate (TOPROL-XL) 24 hr tablet 25 mg, 25 mg, Oral, Daily, Swayze, Ava, DO, 25 mg at 03/30/19 0845 .  ondansetron (ZOFRAN) tablet 4 mg, 4 mg, Oral, Q6H PRN **OR** ondansetron (ZOFRAN) injection 4 mg, 4 mg, Intravenous, Q6H PRN, Opyd, Ilene Qua, MD, 4 mg at 03/30/19 0325 .  pantoprazole (PROTONIX) EC tablet 40 mg, 40 mg, Oral, Daily, Vena Rua, PA-C, 40 mg at 03/30/19 0846 .   prochlorperazine (COMPAZINE) injection 10 mg, 10 mg, Intravenous, Q4H PRN, Swayze, Ava, DO, 10 mg at 03/30/19 0912 .  simethicone (MYLICON) 40 AL/9.3XT suspension 40 mg, 40 mg, Oral, Q6H PRN, Swayze, Ava, DO, 40 mg at 03/29/19 0521  . sodium chloride 150 mL/hr at 03/30/19 0654     Vital signs in last 24 hrs: Vitals:   03/30/19 0326 03/30/19 0830  BP: 103/70 (!) 107/42  Pulse: 100 95  Resp: 16 17  Temp: 98.6 F (37 C) 98.6 F (37 C)  SpO2: 93% 90%    Intake/Output Summary (Last 24 hours) at 03/30/2019 1244 Last data filed at 03/30/2019 0900 Gross per 24 hour  Intake 604.5 ml  Output 300 ml  Net 304.5 ml     Physical Exam His color is better today, he is more comfortable.  He was on the phone with his wife when I entered, and she remained on the phone for the entire encounter.  We had a long discussion of multiple questions answered.  HEENT: sclera anicteric, oral mucosa without lesions  Neck: supple, no thyromegaly, JVD or lymphadenopathy  Cardiac: RRR without murmurs, S1S2 heard, no peripheral edema  Pulm: clear to auscultation bilaterally, normal RR and effort noted  Abdomen: soft, epigastric tenderness (less than yesterday), with active bowel sounds. No guarding, softly distended, not tympanitic.  Difficult to assess organomegaly due to abdominal girth  Skin; warm and dry, no jaundice  Recent Labs:  CBC Latest Ref Rng & Units 03/30/2019 03/29/2019 03/28/2019  WBC 4.0 - 10.5 K/uL 14.9(H) 13.2(H) 10.1  Hemoglobin 13.0 - 17.0 g/dL 10.9(L) 11.1(L) 11.6(L)  Hematocrit 39.0 - 52.0 % 34.3(L) 34.2(L) 35.6(L)  Platelets 150 - 400 K/uL 178 190 237    No results for input(s): INR in the last 168 hours. CMP Latest Ref Rng & Units 03/30/2019 03/29/2019 03/28/2019  Glucose 70 - 99 mg/dL 784(O149(H) 962(X142(H) 528(U193(H)  BUN 8 - 23 mg/dL 11 10 15   Creatinine 0.61 - 1.24 mg/dL 1.321.08 4.401.10 1.021.04  Sodium 135 - 145 mmol/L 135 136 135  Potassium 3.5 - 5.1 mmol/L 4.1 4.0 4.8  Chloride 98 - 111 mmol/L 100  100 101  CO2 22 - 32 mmol/L 24 26 26   Calcium 8.9 - 10.3 mg/dL 7.5(L) 8.1(L) 8.2(L)  Total Protein 6.5 - 8.1 g/dL - 5.7(L) 5.8(L)  Total Bilirubin 0.3 - 1.2 mg/dL - 1.0 7.2(Z1.3(H)  Alkaline Phos 38 - 126 U/L - 54 48  AST 15 - 41 U/L - 43(H) 41  ALT 0 - 44 U/L - 34 24     @ASSESSMENTPLANBEGIN @ Assessment: First episode of acute pancreatitis, unknown cause.  Ultrasound normal, LFTs only notable for minimal AST elevation, unknown chronicity. WBC rising, but clinically improved.  This is not consistent with acute cholecystitis, so I canceled his HIDA scan yesterday.  His wife had hoped he could get done so she could know the "gallbladder function".  While it is conceivable he could have passed some biliary sludge, 1 would expect to see a significant if transient elevation of LFTs in that scenario, which did not occur.  Also, biliary dyskinesia would not cause pancreatitis.  I do not suspect he has a biliary obstruction given his nearly normal LFTs and lack of reported biliary ductal dilatation on ultrasound.  LFT elevation/transaminitis as noted above  Upper abdominal pain from pancreatitis  Plan: Continue supportive care, IV fluids, antiemetics, advance diet as tolerated to low-fat, perhaps by tomorrow. Hopefully he can get home in a day or 2 if continues to improve.  Total time 25 minutes, over half spent in counseling and coordination of care.  Charlie PitterHenry L Danis III Office: [phone]

## 2019-03-30 NOTE — Plan of Care (Signed)

## 2019-03-30 NOTE — Plan of Care (Signed)
  Problem: Education: Goal: Knowledge of General Education information will improve Description: Including pain rating scale, medication(s)/side effects and non-pharmacologic comfort measures Outcome: Progressing   Problem: Clinical Measurements: Goal: Ability to maintain clinical measurements within normal limits will improve Outcome: Progressing   Problem: Nutrition: Goal: Adequate nutrition will be maintained Outcome: Progressing   Problem: Coping: Goal: Level of anxiety will decrease Outcome: Progressing   Problem: Pain Managment: Goal: General experience of comfort will improve Outcome: Progressing   

## 2019-03-30 NOTE — Progress Notes (Addendum)
PROGRESS NOTE  Adam ReefBilly Ringold NWG:[id number] DOB: 05/29/1946 DOA: 03/27/2019 PCP: Shelbie AmmonsHaque, Imran P, MD  Brief History    Adam Kim is a 73 y.o. male with medical history significant for type 2 diabetes mellitus, hypertension, coronary artery disease, and OSA, now presenting to the emergency department for evaluation of nausea and epigastric abdominal pain.  Patient reports that he was in his usual state of health and had an uneventful morning, but developed nausea after lunch that progressively worsened, but without much vomiting.  He went on to develop sharp pain in the epigastrium that was waxing and waning, severe at times, and localized.  He had a small bowel movement earlier in the day.  Denies any recent fevers or chills, has not been coughing, and denies any chest pain.  Patient reports experiencing similar pain several years ago and states that he was treated with Nexium and treated for H. pylori at that time, and the symptoms completely resolved.  Reports that he recently stopped taking ranitidine but has not started any new medications.  ED Course: Upon arrival to the ED, patient is found to be afebrile, saturating adequately on room air, and with remaining vitals also stable.  EKG features normal sinus rhythm and CT the abdomen and pelvis demonstrates stranding about the pancreas consistent with acute pancreatitis, but no visible cholelithiasis.  Chemistry panel features a slight hyponatremia, slight elevation in AST, normal bilirubin, and lipase elevated 1495.  CBC features mild leukocytosis and troponin is negative x2.  Patient was given 500 cc normal saline, Reglan, Zofran, and Dilaudid in the ED.  He is improved but remains uncomfortable despite all of this and hospitalists are asked to admit.  The patient was admitted to a medical bed. He is on a clear liquid diet, but doesn't feel much like eating. Consultants  . None  Procedures  . None  Antibiotics   Anti-infectives (From  admission, onward)   None     .   Subjective   States nausea and abdominal pain is not worse, trying to take clear liquid diet  Objective   Vitals:  Vitals:   03/30/19 0830 03/30/19 1604  BP: (!) 107/42 (!) 127/52  Pulse: 95 88  Resp: 17 17  Temp: 98.6 F (37 C) 99.1 F (37.3 C)  SpO2: 90% 93%    Exam:  Physical Exam   Gen:- Awake Alert, in no acute distress HEENT:- [place].AT, No sclera icterus Neck-Supple Neck,No JVD,.  Lungs-  CTAB  CV- S1, S2 normal Abd-  +ve B.Sounds, Abd Soft, epigastric and periumbilical area tenderness, increased truncal adiposity noted Extremity/Skin:- No  edema,   good pulses Psych-affect is appropriate, oriented x3 Neuro-no new focal deficits, no tremors   Today's Data  . CBC, Lipase, CMP.  .  Imaging  Right upper quadrant ultrasound: No acute finding or biliary calculus  Scheduled Meds: . clopidogrel  75 mg Oral Daily  . enoxaparin (LOVENOX) injection  40 mg Subcutaneous Q24H  . insulin aspart  0-9 Units Subcutaneous Q4H  . metoprolol succinate  25 mg Oral Daily  . pantoprazole  40 mg Oral Daily   Continuous Infusions: . sodium chloride 150 mL/hr at 03/30/19 1752    Principal Problem:   Acute pancreatitis Active Problems:   CAD (coronary artery disease), native coronary artery   Type 2 diabetes mellitus (HCC)   Hypertension   Obstructive sleep apnea   LOS: 2 days   A & P   1)Acute pancreatitis: --- GI consult appreciated per GI  service cholecystitis less likely, no need for HIDA scan, patient tolerated clear liquid diet today passing gas, no BM, oral intake remains poor , abdominal pain and nausea is not worse... Patient continues to require aggressive IV fluids due to poor oral intake in the setting of severe acute pancreatitis  CAD--stable, continue Plavix, Lipitor on hold due to concerns about contributing to pancreatitis,   Type II DM: Stable oral intake is poor, Allow some permissive Hyperglycemia rather than risk  life-threatening hypoglycemia in a patient with unreliable oral intake. Use Novolog/Humalog Sliding scale insulin with Accu-Cheks/Fingersticks as ordered  use Novolog/Humalog Sliding scale insulin with Accu-Cheks/Fingersticks as ordered, continue to hold Actos and metformin   Hypertension: Stable, continue metoprolol , use hydralazine IVP as needed for elevated BP   ---  -Morbid obesity--this complicates overall care patient's weight is over 120 kg  DVT prophylaxis: Lovenox  Code Status: Full  Family Communication:  wife   Disposition:Home  Roxan Hockey, MD [facility] Direct contact: see [email or web address]  7PM-7AM contact night coverage as above

## 2019-03-30 NOTE — Progress Notes (Signed)
Patient has home CPAP at bedside. 

## 2019-03-31 LAB — COMPREHENSIVE METABOLIC PANEL
ALT: 33 U/L (ref 0–44)
AST: 34 U/L (ref 15–41)
Albumin: 2.6 g/dL — ABNORMAL LOW (ref 3.5–5.0)
Alkaline Phosphatase: 64 U/L (ref 38–126)
Anion gap: 9 (ref 5–15)
BUN: 10 mg/dL (ref 8–23)
CO2: 25 mmol/L (ref 22–32)
Calcium: 7.7 mg/dL — ABNORMAL LOW (ref 8.9–10.3)
Chloride: 102 mmol/L (ref 98–111)
Creatinine, Ser: 0.97 mg/dL (ref 0.61–1.24)
GFR calc Af Amer: >60 mL/min (ref >60)
GFR calc non Af Amer: >60 mL/min (ref >60)
Glucose, Bld: 136 mg/dL — ABNORMAL HIGH (ref 70–99)
Potassium: 4 mmol/L (ref 3.5–5.1)
Sodium: 136 mmol/L (ref 135–145)
Total Bilirubin: 1.6 mg/dL — ABNORMAL HIGH (ref 0.3–1.2)
Total Protein: 5.7 g/dL — ABNORMAL LOW (ref 6.5–8.1)

## 2019-03-31 LAB — GLUCOSE, CAPILLARY
Glucose-Capillary: 120 mg/dL — ABNORMAL HIGH (ref 70–99)
Glucose-Capillary: 124 mg/dL — ABNORMAL HIGH (ref 70–99)
Glucose-Capillary: 151 mg/dL — ABNORMAL HIGH (ref 70–99)
Glucose-Capillary: 119 mg/dL — ABNORMAL HIGH (ref 70–99)
Glucose-Capillary: 161 mg/dL — ABNORMAL HIGH (ref 70–99)

## 2019-03-31 LAB — CBC
HCT: 33.2 % — ABNORMAL LOW (ref 39.0–52.0)
Hemoglobin: 10.6 g/dL — ABNORMAL LOW (ref 13.0–17.0)
MCH: 29.9 pg (ref 26.0–34.0)
MCHC: 31.9 g/dL (ref 30.0–36.0)
MCV: 93.5 fL (ref 80.0–100.0)
Platelets: 212 10*3/uL (ref 150–400)
RBC: 3.55 MIL/uL — ABNORMAL LOW (ref 4.22–5.81)
RDW: 14.1 % (ref 11.5–15.5)
WBC: 13 10*3/uL — ABNORMAL HIGH (ref 4.0–10.5)
nRBC: 0.3 % — ABNORMAL HIGH (ref 0.0–0.2)

## 2019-03-31 NOTE — Plan of Care (Signed)

## 2019-03-31 NOTE — Progress Notes (Signed)
**Adam Kim** Adam Kim  Chief Complaint: Acute pancreatitis  History:  His abdominal pain is decreasing.  He has bloating, is passing gas but no BM for couple of days.  Nausea has subsided, no vomiting.  Tolerated clear liquids all day yesterday.  ROS: Cardiovascular: Denies chest pain Respiratory: No dyspnea Urinary: No dysuria  Objective:   Current Facility-Administered Medications:  .  0.9 %  sodium chloride infusion, , Intravenous, Continuous, Emokpae, Courage, MD, Last Rate: 100 mL/hr at 03/31/19 0916 .  acetaminophen (TYLENOL) tablet 650 mg, 650 mg, Oral, Q6H PRN **OR** acetaminophen (TYLENOL) suppository 650 mg, 650 mg, Rectal, Q6H PRN, Opyd, Timothy S, MD .  clopidogrel (PLAVIX) tablet 75 mg, 75 mg, Oral, Daily, Swayze, Ava, DO, 75 mg at 03/31/19 0933 .  enoxaparin (LOVENOX) injection 40 mg, 40 mg, Subcutaneous, Q24H, Opyd, Ilene Qua, MD, 40 mg at 03/31/19 0606 .  hydrALAZINE (APRESOLINE) injection 10 mg, 10 mg, Intravenous, Q4H PRN, Opyd, Timothy S, MD .  HYDROcodone-acetaminophen (NORCO/VICODIN) 5-325 MG per tablet 1-2 tablet, 1-2 tablet, Oral, Q4H PRN, Swayze, Ava, DO .  HYDROmorphone (DILAUDID) injection 0.5-1 mg, 0.5-1 mg, Intravenous, Q4H PRN, Opyd, Ilene Qua, MD, 0.5 mg at 03/31/19 0937 .  insulin aspart (novoLOG) injection 0-9 Units, 0-9 Units, Subcutaneous, Q4H, Opyd, Ilene Qua, MD, 1 Units at 03/31/19 0824 .  metoprolol succinate (TOPROL-XL) 24 hr tablet 25 mg, 25 mg, Oral, Daily, Swayze, Ava, DO, 25 mg at 03/31/19 0933 .  ondansetron (ZOFRAN) tablet 4 mg, 4 mg, Oral, Q6H PRN **OR** ondansetron (ZOFRAN) injection 4 mg, 4 mg, Intravenous, Q6H PRN, Opyd, Ilene Qua, MD, 4 mg at 03/30/19 0325 .  pantoprazole (PROTONIX) EC tablet 40 mg, 40 mg, Oral, Daily, Vena Rua, PA-C, 40 mg at 03/31/19 1740 .  prochlorperazine (COMPAZINE) injection 10 mg, 10 mg, Intravenous, Q4H PRN, Swayze, Ava, DO, 10 mg at 03/30/19 2117 .  simethicone (MYLICON) 40 CX/4.4YJ suspension 40 mg,  40 mg, Oral, Q6H PRN, Swayze, Ava, DO, 40 mg at 03/30/19 2116  . sodium chloride 100 mL/hr at 03/31/19 0916     Vital signs in last 24 hrs: Vitals:   03/31/19 0751 03/31/19 0919  BP: (!) 132/47 (!) 134/58  Pulse: 87 85  Resp: 15   Temp: 100 F (37.8 C) 98.6 F (37 C)  SpO2: 91%     Intake/Output Summary (Last 24 hours) at 03/31/2019 8563 Last data filed at 03/31/2019 0600 Gross per 24 hour  Intake 5013.61 ml  Output -  Net 5013.61 ml     Physical Exam His overall appearance and affect are improved today.  HEENT: sclera anicteric, oral mucosa without lesions  Neck: supple, no thyromegaly, JVD or lymphadenopathy  Cardiac: RRR without murmurs, S1S2 heard, no peripheral edema  Pulm: clear to auscultation bilaterally, normal RR and effort noted  Abdomen: soft, mild upper tenderness, with active bowel sounds.  Skin; warm and dry, no jaundice  Recent Labs:  CBC Latest Ref Rng & Units 03/30/2019 03/29/2019 03/28/2019  WBC 4.0 - 10.5 K/uL 14.9(H) 13.2(H) 10.1  Hemoglobin 13.0 - 17.0 g/dL 10.9(L) 11.1(L) 11.6(L)  Hematocrit 39.0 - 52.0 % 34.3(L) 34.2(L) 35.6(L)  Platelets 150 - 400 K/uL 178 190 237    No results for input(s): INR in the last 168 hours. CMP Latest Ref Rng & Units 03/30/2019 03/29/2019 03/28/2019  Glucose 70 - 99 mg/dL 149(H) 142(H) 193(H)  BUN 8 - 23 mg/dL 11 10 15   Creatinine 0.61 - 1.24 mg/dL 1.08 1.10 1.04  Sodium 135 -  145 mmol/L 135 136 135  Potassium 3.5 - 5.1 mmol/L 4.1 4.0 4.8  Chloride 98 - 111 mmol/L 100 100 101  CO2 22 - 32 mmol/L 24 26 26   Calcium 8.9 - 10.3 mg/dL 7.5(L) 8.1(L) 8.2(L)  Total Protein 6.5 - 8.1 g/dL - 5.7(L) 5.8(L)  Total Bilirubin 0.3 - 1.2 mg/dL - 1.0 1.6(X1.3(H)  Alkaline Phos 38 - 126 U/L - 54 48  AST 15 - 41 U/L - 43(H) 41  ALT 0 - 44 U/L - 34 24     @ASSESSMENTPLANBEGIN @ Assessment: Acute pancreatitis of unclear cause Upper abdominal pain Nausea  He is clinically improved today.   Plan: Diet was advanced to full  liquids Normal saline IV fluids decreased from 150 cc an hour to 50/ hour.  When he is consistently taking enough liquids and nutrition by mouth, this can be discontinued altogether.  If he does well today, most likely start low-fat diet tomorrow and hope for discharge over the weekend.  We will follow.  Total time 20 minutes  Adam PitterHenry L Danis Kim Office: [phone]

## 2019-03-31 NOTE — Progress Notes (Signed)
RN updated pt's wife, Jackelyn Poling, on pt status. All questions answered to satisfaction. Debbie requesting GI MD update. Wilfrid Lund III, MD aware. Will continue to monitor.

## 2019-03-31 NOTE — Plan of Care (Signed)
  Problem: Education: Goal: Knowledge of General Education information will improve Description: Including pain rating scale, medication(s)/side effects and non-pharmacologic comfort measures Outcome: Progressing   Problem: Clinical Measurements: Goal: Ability to maintain clinical measurements within normal limits will improve Outcome: Progressing   Problem: Nutrition: Goal: Adequate nutrition will be maintained Outcome: Progressing   Problem: Pain Managment: Goal: General experience of comfort will improve Outcome: Progressing   Problem: Safety: Goal: Ability to remain free from injury will improve Outcome: Progressing   

## 2019-03-31 NOTE — Progress Notes (Signed)
  PROGRESS NOTE  Trig Mcbryar LKG:[id number] DOB: [birth year] DOA: 03/27/2019 PCP: Raelyn Number, MD  Brief History    Kodey Xue is a 73 y.o. male with medical history significant for type 2 diabetes mellitus, hypertension, coronary artery disease, and OSA, admitted on 03/28/2019 due to nausea and epigastric abdominal pain--- found to have acute pancreatitis  ---.  Consultants  . None  Procedures  . None  Antibiotics   Anti-infectives (From admission, onward)   None     .   Subjective   No fevers, no emesis, nausea improving, tolerating clear liquid diet,  Objective   Vitals:  Vitals:   03/31/19 0919 03/31/19 1635  BP: (!) 134/58 137/63  Pulse: 85 87  Resp:  16  Temp: 98.6 F (37 C) 100.3 F (37.9 C)  SpO2:  97%    Exam:  Physical Exam   Gen:- Awake Alert, in no acute distress HEENT:- [place].AT, No sclera icterus Neck-Supple Neck,No JVD,.  Lungs-  CTAB  CV- S1, S2 normal Abd-  +ve B.Sounds, Abd Soft, epigastric and periumbilical area tenderness, increased truncal adiposity noted Extremity/Skin:- No  edema,   good pulses Psych-affect is appropriate, oriented x3 Neuro-no new focal deficits, no tremors  Today's Data  . CBC, Lipase, CMP.  .  Imaging  Right upper quadrant ultrasound: No acute finding or biliary calculus  Scheduled Meds: . clopidogrel  75 mg Oral Daily  . enoxaparin (LOVENOX) injection  40 mg Subcutaneous Q24H  . insulin aspart  0-9 Units Subcutaneous Q4H  . metoprolol succinate  25 mg Oral Daily  . pantoprazole  40 mg Oral Daily   Continuous Infusions: . sodium chloride 50 mL/hr at 03/31/19 1246    Principal Problem:   Acute pancreatitis Active Problems:   CAD (coronary artery disease), native coronary artery   Type 2 diabetes mellitus (HCC)   Hypertension   Obstructive sleep apnea   LOS: 3 days   A & P   1)Acute pancreatitis: --- GI consult appreciated,  per GI service cholecystitis less likely, no need for HIDA scan,  will advance diet to full liquids,, oral intake remains poor , abdominal pain and nausea improving ... Continue Protonix .  PRN antiemetics and PRN Dilaudid patient continues to require aggressive IV fluids due to poor oral intake in the setting of severe acute pancreatitis  2)CAD--stable, continue Plavix, Lipitor on hold due to concerns about contributing to pancreatitis,   3)Type II DM:  oral intake is poor, Allow some permissive Hyperglycemia rather than risk life-threatening hypoglycemia in a patient with unreliable oral intake. Use Novolog/Humalog Sliding scale insulin with Accu-Cheks/Fingersticks as ordered --continue to hold Actos and metformin   4)Hypertension: Stable, continue Metoprolol XL 25 mg daily,  may use IV Hydralazine 10 mg  Every 4 hours Prn for systolic blood pressure over 160 mmhg  5)Morbid obesity--this complicates overall care patient's weight is over 120 kg  DVT prophylaxis: Lovenox  Code Status: Full  Family Communication:  Discussed with wife-- Jackelyn Poling at [phone]   Disposition: Home when tolerating oral intake better   Roxan Hockey, MD [facility] Direct contact: see [email or web address]  7PM-7AM contact night coverage as above

## 2019-03-31 NOTE — Progress Notes (Signed)
Patient has unit at bedside and was already on CPAP upon entering room. Patient tolerating well at this time.

## 2019-04-01 DIAGNOSIS — I2510 Atherosclerotic heart disease of native coronary artery without angina pectoris: Secondary | ICD-10-CM

## 2019-04-01 LAB — CBC
HCT: 31.8 % — ABNORMAL LOW (ref 39.0–52.0)
Hemoglobin: 10.3 g/dL — ABNORMAL LOW (ref 13.0–17.0)
MCH: 29.8 pg (ref 26.0–34.0)
MCHC: 32.4 g/dL (ref 30.0–36.0)
MCV: 91.9 fL (ref 80.0–100.0)
Platelets: 221 10*3/uL (ref 150–400)
RBC: 3.46 MIL/uL — ABNORMAL LOW (ref 4.22–5.81)
RDW: 13.9 % (ref 11.5–15.5)
WBC: 10.3 10*3/uL (ref 4.0–10.5)
nRBC: 1.1 % — ABNORMAL HIGH (ref 0.0–0.2)

## 2019-04-01 LAB — GLUCOSE, CAPILLARY
Glucose-Capillary: 153 mg/dL — ABNORMAL HIGH (ref 70–99)
Glucose-Capillary: 152 mg/dL — ABNORMAL HIGH (ref 70–99)
Glucose-Capillary: 130 mg/dL — ABNORMAL HIGH (ref 70–99)
Glucose-Capillary: 140 mg/dL — ABNORMAL HIGH (ref 70–99)
Glucose-Capillary: 165 mg/dL — ABNORMAL HIGH (ref 70–99)

## 2019-04-01 LAB — LIPID PANEL
Cholesterol: 135 mg/dL (ref 0–200)
HDL: 39 mg/dL — ABNORMAL LOW (ref >40)
LDL Cholesterol: 76 mg/dL (ref 0–99)
Total CHOL/HDL Ratio: 3.5 RATIO
Triglycerides: 99 mg/dL (ref <150)
VLDL: 20 mg/dL (ref 0–40)

## 2019-04-01 LAB — BASIC METABOLIC PANEL
Anion gap: 9 (ref 5–15)
BUN: 8 mg/dL (ref 8–23)
CO2: 25 mmol/L (ref 22–32)
Calcium: 7.9 mg/dL — ABNORMAL LOW (ref 8.9–10.3)
Chloride: 102 mmol/L (ref 98–111)
Creatinine, Ser: 0.91 mg/dL (ref 0.61–1.24)
GFR calc Af Amer: >60 mL/min (ref >60)
GFR calc non Af Amer: >60 mL/min (ref >60)
Glucose, Bld: 155 mg/dL — ABNORMAL HIGH (ref 70–99)
Potassium: 3.5 mmol/L (ref 3.5–5.1)
Sodium: 136 mmol/L (ref 135–145)

## 2019-04-01 LAB — HEMOGLOBIN A1C
Hgb A1c MFr Bld: 7.9 % — ABNORMAL HIGH (ref 4.8–5.6)
Mean Plasma Glucose: 180.03 mg/dL

## 2019-04-01 MED ORDER — POLYETHYLENE GLYCOL 3350 17 G PO PACK
17.0000 g | PACK | Freq: Every day | ORAL | Status: DC
  Administered 2019-04-01: 17 g via ORAL
  Filled 2019-04-01 (×2): qty 1

## 2019-04-01 MED ORDER — SODIUM CHLORIDE 0.9 % IV SOLN
INTRAVENOUS | Status: DC
  Administered 2019-04-01 – 2019-04-02 (×2): via INTRAVENOUS

## 2019-04-01 NOTE — Progress Notes (Addendum)
Patient ID: Adam Kim, male   DOB: [birth year], 73 y.o.   MRN: [id number]    Progress Note   Subjective  Acute pancreatitis of unknown cause  Day # 5 Feeling better than on admit - still rates pain as 7/10- feels full, gassy , bloated - trying oral pain meds No BM today, had BM yesterday First solid food at lunch - didn't taste good - didn't eat     Objective   Vital signs in last 24 hours: Temp:  [97.7 F (36.5 C)-100.3 F (37.9 C)] 98.9 F (37.2 C) (07/04 0801) Pulse Rate:  [81-87] 87 (07/04 0801) Resp:  [16] 16 (07/03 1635) BP: (137-143)/(63-67) 138/64 (07/04 0801) SpO2:  [92 %-97 %] 94 % (07/04 0801) Weight:  [40 kg] 40 kg (07/04 0309) Last BM Date: 03/31/19 General:    white male in NAD, sitting on side of bed Heart:  Regular rate and rhythm; no murmurs Lungs: Respirations even and unlabored, lungs CTA bilaterally Abdomen:  Soft, obese nontender and nondistended. Normal bowel sounds. Extremities:  Without edema. Neurologic:  Alert and oriented,  grossly normal neurologically. Psych:  Cooperative. Normal mood and affect.  Intake/Output from previous day: 07/03 0701 - 07/04 0700 In: 3531.2 [P.O.:720; I.V.:2811.2] Out: -  Intake/Output this shift: No intake/output data recorded.  Lab Results: Recent Labs    03/30/19 0723 03/31/19 0911 04/01/19 0944  WBC 14.9* 13.0* 10.3  HGB 10.9* 10.6* 10.3*  HCT 34.3* 33.2* 31.8*  PLT 178 212 221   BMET Recent Labs    03/30/19 0723 03/31/19 0911 04/01/19 0726  NA 135 136 136  K 4.1 4.0 3.5  CL 100 102 102  CO2 24 25 25   GLUCOSE 149* 136* 155*  BUN 11 10 8   CREATININE 1.08 0.97 0.91  CALCIUM 7.5* 7.7* 7.9*   LFT Recent Labs    03/31/19 0911  PROT 5.7*  ALBUMIN 2.6*  AST 34  ALT 33  ALKPHOS 64  BILITOT 1.6*   PT/INR No results for input(s): LABPROT, INR in the last 72 hours.       Assessment / Plan:     #1 73 yo WM with acute pancreatitis- etiology not clear Improving slowly No worrisome  parameters.  Leukocytosis resolved  triglycerides- normal US/ CT - no gallstones or ductal dilation NO ETOH- past but not current Home meds reviewed- no known offending agents  CT was done non contrasted -  He should have a contrasted CT or MRI pancreas as outpt after acute episode resolves Check IGG4 in outpatient setting after pancreatitis completely resolved  Will add Mira lax  Daily for bowels If tolerating food , and comfortable on oral pain meds can be D/C tomorrow with out pt follow up with GI   Principal Problem:   Acute pancreatitis Active Problems:   CAD (coronary artery disease), native coronary artery   Type 2 diabetes mellitus ([place])   Hypertension   Obstructive sleep apnea     LOS: 4 days   Amy EsterwoodPA-C  04/01/2019, 1:32 PM   I have discussed the case with the PA, and that is the plan I formulated. I personally interviewed and examined the patient.  He is making slow progress. When he feels ready, advance to low-fat diet.  When tolerating that with oral pain medication, he can be discharged home for outpatient follow-up with Dr. Lyndel Safe.   Total time 25 minutes Nelida Meuse III Office: [phone]

## 2019-04-01 NOTE — Progress Notes (Signed)
PROGRESS NOTE  Adam Kim JWJ:[id number] DOB: 01/21/1946 DOA: 03/27/2019 PCP: Shelbie AmmonsHaque, Imran P, MD   LOS: 4 days   Patient is from: Home  Brief Narrative / Interim history: 73 year old male with history of DM-2, HTN and CAD admitted 03/28/2019 with nausea and epigastric abdominal pain and found to have acute uncomplicated pancreatitis.  Subjective: No major events overnight of this morning.  Continues to endorse significant abdominal pain.  Rates his pain as 7/10 but has slightly improved to 6/10 after pain medication.  Reports some loose bowel movements overnight.  Denies emesis.  Denies fever or chills.  Denies chest pain or dyspnea.  Objective: Vitals:   03/31/19 1932 04/01/19 0309 04/01/19 0411 04/01/19 0801  BP: (!) 143/66  139/67 138/64  Pulse: 81  85 87  Resp:      Temp:   97.7 F (36.5 C) 98.9 F (37.2 C)  TempSrc: Oral  Axillary Axillary  SpO2: 92%  92% 94%  Weight:  40 kg    Height:        Intake/Output Summary (Last 24 hours) at 04/01/2019 1328 Last data filed at 04/01/2019 0500 Gross per 24 hour  Intake 3051.23 ml  Output -  Net 3051.23 ml   Filed Weights   03/30/19 0500 03/31/19 0500 04/01/19 0309  Weight: 40.8 kg 40.6 kg 40 kg    Examination:  GENERAL: No acute distress.  Appears well.  HEENT: MMM.  Vision and hearing grossly intact.  NECK: Supple.  No apparent JVD. LUNGS:  No IWOB.  Fair air movement bilaterally. HEART:  RRR. Heart sounds normal.  ABD: BS present. Soft.  Mild tenderness to deep palpation.  Difficult exam due to body habitus. MSK/EXT:  Moves extremities. No apparent deformity or edema.  SKIN: no apparent skin lesion or wound NEURO: Awake, alert and oriented appropriately.  No gross deficit.  PSYCH: Calm. Normal affect.     I have personally reviewed the following labs and images:  Radiology Studies: No results found.  Microbiology: Recent Results (from the past 240 hour(s))  SARS Coronavirus 2 (CEPHEID - Performed in [facility]), Hosp Order     Status: None   Collection Time: 03/28/19  1:10 AM   Specimen: Nasopharyngeal Swab  Result Value Ref Range Status   SARS Coronavirus 2 NEGATIVE NEGATIVE Final    Comment: (NOTE) If result is NEGATIVE SARS-CoV-2 target nucleic acids are NOT DETECTED. The SARS-CoV-2 RNA is generally detectable in upper and lower  respiratory specimens during the acute phase of infection. The lowest  concentration of SARS-CoV-2 viral copies this assay can detect is 250  copies / mL. A negative result does not preclude SARS-CoV-2 infection  and should not be used as the sole basis for treatment or other  patient management decisions.  A negative result may occur with  improper specimen collection / handling, submission of specimen other  than nasopharyngeal swab, presence of viral mutation(s) within the  areas targeted by this assay, and inadequate number of viral copies  (<250 copies / mL). A negative result must be combined with clinical  observations, patient history, and epidemiological information. If result is POSITIVE SARS-CoV-2 target nucleic acids are DETECTED. The SARS-CoV-2 RNA is generally detectable in upper and lower  respiratory specimens dur ing the acute phase of infection.  Positive  results are indicative of active infection with SARS-CoV-2.  Clinical  correlation with patient history and other diagnostic information is  necessary to determine patient infection status.  Positive results do  not rule out bacterial infection or co-infection with other viruses. If result is PRESUMPTIVE POSTIVE SARS-CoV-2 nucleic acids MAY BE PRESENT.   A presumptive positive result was obtained on the submitted specimen  and confirmed on repeat testing.  While 2019 novel coronavirus  (SARS-CoV-2) nucleic acids may be present in the submitted sample  additional confirmatory testing may be necessary for epidemiological  and / or clinical management purposes  to  differentiate between  SARS-CoV-2 and other Sarbecovirus currently known to infect humans.  If clinically indicated additional testing with an alternate test  methodology [phone]) is advised. The SARS-CoV-2 RNA is generally  detectable in upper and lower respiratory sp ecimens during the acute  phase of infection. The expected result is Negative. Fact Sheet for Patients:  StrictlyIdeas.no Fact Sheet for Healthcare Providers: BankingDealers.co.za This test is not yet approved or cleared by the [place] FDA and has been authorized for detection and/or diagnosis of SARS-CoV-2 by FDA under an Emergency Use Authorization (EUA).  This EUA will remain in effect (meaning this test can be used) for the duration of the COVID-19 declaration under Section 564(b)(1) of the Act, 21 U.S.C. section 360bbb-3(b)(1), unless the authorization is terminated or revoked sooner. Performed at [facility], [place] [address], [place], [place] [address]     Sepsis Labs: Invalid input(s): PROCALCITONIN, LACTICIDVEN  Urine analysis:    Component Value Date/Time   COLORURINE YELLOW 03/28/2019 0221   APPEARANCEUR CLEAR 03/28/2019 0221   LABSPEC 1.019 03/28/2019 0221   PHURINE 5.0 03/28/2019 0221   GLUCOSEU 150 (A) 03/28/2019 0221   HGBUR MODERATE (A) 03/28/2019 0221   BILIRUBINUR NEGATIVE 03/28/2019 0221   KETONESUR 5 (A) 03/28/2019 0221   PROTEINUR NEGATIVE 03/28/2019 0221   NITRITE NEGATIVE 03/28/2019 0221   LEUKOCYTESUR NEGATIVE 03/28/2019 0221    Anemia Panel: No results for input(s): VITAMINB12, FOLATE, FERRITIN, TIBC, IRON, RETICCTPCT in the last 72 hours.  Thyroid Function Tests: No results for input(s): TSH, T4TOTAL, FREET4, T3FREE, THYROIDAB in the last 72 hours.  Lipid Profile: No results for input(s): CHOL, HDL, LDLCALC, TRIG, CHOLHDL, LDLDIRECT in the last 72 hours.  CBG: Recent Labs  Lab 03/31/19 1632 03/31/19 1936 04/01/19  0416 04/01/19 0801 04/01/19 1232  GLUCAP 119* 161* 153* 152* 130*    HbA1C: No results for input(s): HGBA1C in the last 72 hours.  BNP (last 3 results): No results for input(s): PROBNP in the last 8760 hours.  Cardiac Enzymes: No results for input(s): CKTOTAL, CKMB, CKMBINDEX, TROPONINI in the last 168 hours.  Coagulation Profile: No results for input(s): INR, PROTIME in the last 168 hours.  Liver Function Tests: Recent Labs  Lab 03/27/19 1805 03/28/19 0525 03/29/19 0326 03/31/19 0911  AST 49* 41 43* 34  ALT 25 24 34 33  ALKPHOS 61 48 54 64  BILITOT 0.7 1.3* 1.0 1.6*  PROT 6.3* 5.8* 5.7* 5.7*  ALBUMIN 3.8 3.5 3.3* 2.6*   Recent Labs  Lab 03/27/19 1805 03/28/19 0525 03/29/19 0326  LIPASE 1,495* 582* 82*   No results for input(s): AMMONIA in the last 168 hours.  Basic Metabolic Panel: Recent Labs  Lab 03/28/19 0525 03/29/19 0326 03/30/19 0723 03/31/19 0911 04/01/19 0726  NA 135 136 135 136 136  K 4.8 4.0 4.1 4.0 3.5  CL 101 100 100 102 102  CO2 26 26 24 25 25   GLUCOSE 193* 142* 149* 136* 155*  BUN 15 10 11 10 8   CREATININE 1.04 1.10 1.08 0.97 0.91  CALCIUM 8.2* 8.1*  7.5* 7.7* 7.9*   GFR: Estimated Creatinine Clearance: 42.1 mL/min (by C-G formula based on SCr of 0.91 mg/dL).  CBC: Recent Labs  Lab 03/28/19 0525 03/29/19 0326 03/30/19 0723 03/31/19 0911 04/01/19 0944  WBC 10.1 13.2* 14.9* 13.0* 10.3  NEUTROABS 7.9* 10.1*  --   --   --   HGB 11.6* 11.1* 10.9* 10.6* 10.3*  HCT 35.6* 34.2* 34.3* 33.2* 31.8*  MCV 91.8 91.7 92.7 93.5 91.9  PLT 237 190 178 212 221    Procedures:  None  Microbiology: COVID-19 negative  Assessment & Plan: Acute uncomplicated pancreatitis of unknown etiology-not a drinker. -Lipase initially elevated to 1400 and down to 82.  Calcium level within normal range.  -CT A/P revealed acute pancreatitis without complication.  No visible gallstone. -RUQ ultrasound not impressive. -Mild leukocytosis improved without  antibiotics. -Check lipid panel, and recheck lipase in the morning -Continue IV fluid and advance diet to soft -IV Dilaudid and Norco for pain, and Zofran and Compazine for nausea/emesis. -Follow GI recommendations  Chronic CAD: On metoprolol, Atacand, Plavix and a statin at home.  Unclear if he still needs Plavix.  No anginal symptoms. -Continue home meds except Atacand -Need to clarify need for Plavix.  Hypertension: Normotensive -Continue home metoprolol -PRN hydralazine  NIDDM-2: On metformin and Actos at home.  CBG within appropriate range. -Check A1c -Continue current sliding scale and CBG monitoring -Hold home medications -Could benefit from newer diabetic agents with cardiovascular and weight benefits -Actos might cause fluid retention and weight gain. -Continue statin.  Morbid obesity: BMI inaccurate in chart -Encourage lifestyle change to lose weight  Left nephrolithiasis: Incidental finding.  No hydronephrosis  OSA on CPAP -Continue nightly CPAP-compliant.  DVT prophylaxis: Subcu heparin Code Status: Full code Family Communication: Patient update family and let me any question Disposition Plan: Remains inpatient for acute pancreatitis pending good p.o. tolerance and clearance by GI. Consultants: Gastroenterology  Antimicrobials: Anti-infectives (From admission, onward)   None      Sch Meds:  Scheduled Meds: . clopidogrel  75 mg Oral Daily  . enoxaparin (LOVENOX) injection  40 mg Subcutaneous Q24H  . insulin aspart  0-9 Units Subcutaneous Q4H  . metoprolol succinate  25 mg Oral Daily  . pantoprazole  40 mg Oral Daily   Continuous Infusions: . sodium chloride 50 mL/hr at 04/01/19 1256   PRN Meds:.acetaminophen **OR** acetaminophen, hydrALAZINE, HYDROcodone-acetaminophen, HYDROmorphone (DILAUDID) injection, ondansetron **OR** ondansetron (ZOFRAN) IV, prochlorperazine, simethicone    T.  [facility]  If 7PM-7AM, please contact  night-coverage [email or web address] Password [email or web address] 04/01/2019, 1:28 PM

## 2019-04-01 NOTE — Progress Notes (Signed)
Patient placed himself on home CPAP unit for the night.  

## 2019-04-01 NOTE — Progress Notes (Signed)
Pt is talking to his wife with this RN present in the room. Per pt, I don't have to call his wife today for an update.

## 2019-04-02 DIAGNOSIS — R748 Abnormal levels of other serum enzymes: Secondary | ICD-10-CM

## 2019-04-02 LAB — COMPREHENSIVE METABOLIC PANEL WITH GFR
ALT: 73 U/L — ABNORMAL HIGH (ref 0–44)
AST: 70 U/L — ABNORMAL HIGH (ref 15–41)
Albumin: 2.4 g/dL — ABNORMAL LOW (ref 3.5–5.0)
Alkaline Phosphatase: 103 U/L (ref 38–126)
Anion gap: 12 (ref 5–15)
BUN: 6 mg/dL — ABNORMAL LOW (ref 8–23)
CO2: 25 mmol/L (ref 22–32)
Calcium: 8.1 mg/dL — ABNORMAL LOW (ref 8.9–10.3)
Chloride: 100 mmol/L (ref 98–111)
Creatinine, Ser: 1 mg/dL (ref 0.61–1.24)
GFR calc Af Amer: >60 mL/min
GFR calc non Af Amer: >60 mL/min
Glucose, Bld: 179 mg/dL — ABNORMAL HIGH (ref 70–99)
Potassium: 3.5 mmol/L (ref 3.5–5.1)
Sodium: 137 mmol/L (ref 135–145)
Total Bilirubin: 1.3 mg/dL — ABNORMAL HIGH (ref 0.3–1.2)
Total Protein: 5.6 g/dL — ABNORMAL LOW (ref 6.5–8.1)

## 2019-04-02 LAB — CBC
HCT: 32.3 % — ABNORMAL LOW (ref 39.0–52.0)
Hemoglobin: 10.5 g/dL — ABNORMAL LOW (ref 13.0–17.0)
MCH: 29.6 pg (ref 26.0–34.0)
MCHC: 32.5 g/dL (ref 30.0–36.0)
MCV: 91 fL (ref 80.0–100.0)
Platelets: 240 10*3/uL (ref 150–400)
RBC: 3.55 MIL/uL — ABNORMAL LOW (ref 4.22–5.81)
RDW: 14.1 % (ref 11.5–15.5)
WBC: 10.3 10*3/uL (ref 4.0–10.5)
nRBC: 1.8 % — ABNORMAL HIGH (ref 0.0–0.2)

## 2019-04-02 LAB — LIPASE, BLOOD: Lipase: 17 U/L (ref 11–51)

## 2019-04-02 MED ORDER — OXYCODONE HCL 5 MG PO TABS: 5.0000 mg | ORAL_TABLET | Freq: Four times a day (QID) | ORAL | 0 refills | Status: AC | PRN

## 2019-04-02 MED ORDER — SENNA 8.6 MG PO TABS: 1.0000 | ORAL_TABLET | Freq: Every day | ORAL | 0 refills | Status: DC | PRN

## 2019-04-02 MED ORDER — ONDANSETRON HCL 4 MG PO TABS: 4.0000 mg | ORAL_TABLET | Freq: Four times a day (QID) | ORAL | 0 refills | Status: DC | PRN

## 2019-04-02 MED ORDER — FAMOTIDINE 20 MG PO TABS: 20.0000 mg | ORAL_TABLET | Freq: Two times a day (BID) | ORAL | 1 refills | Status: DC

## 2019-04-02 NOTE — Progress Notes (Signed)
Pt is ambulating independently, denies abd pain this morning, tolerating soft diet.  Feeling a lot better today. Discharge instructions given to pt, verbalized understanding. Discharged to home, picked up by wife.

## 2019-04-02 NOTE — Discharge Summary (Signed)
Physician Discharge Summary  Adam Kim PPJ:[id number] DOB: 1945-10-12 DOA: 03/27/2019  PCP: Raelyn Number, MD  Admit date: 03/27/2019 Discharge date: 04/02/2019   Admitted From: Home Disposition: Home  Recommendations for Outpatient Follow-up:  1. Follow up with PCP and/or gastroenterology in 1-2 weeks 2. Please obtain CBC/CMP/Mag at follow up 3. Please follow up on the following pending results: IgG 4  Home Health: None Equipment/Devices: None  Discharge Condition: Stable CODE STATUS: Full code   Hospital Course: 73 year old male with history of DM-2, HTN and CAD admitted 03/28/2019 with nausea and epigastric abdominal pain and found to have acute uncomplicated idiopathic pancreatitis.  Improved with bowel rest, IV fluid and pain control.  Patient tolerated solid diet on the day of discharge.  See individual problem list below for more. Discharge Diagnoses:  Acute uncomplicated pancreatitis: not a drinker.  Calcium and triglyceride level normal. -Lipase initially elevated to 1400 and down to 17 on the day of discharge. -CT A/P revealed acute pancreatitis without complication.  No visible gallstone. -RUQ ultrasound not impressive. -Mild leukocytosis improved without antibiotics. -Patient tolerated soft solid food for 24 hours. -Discharged to follow-up with PCP and GI -Follow IgG4  Elevated liver enzymes: On the day of discharge, AST/ALT elevated to 70/73 respectively -Atorvastatin discontinued -Recommend rechecking at follow-up.  Chronic CAD: On metoprolol, Atacand, Plavix and a statin at home.  Unclear if he still needs Plavix.  No anginal symptoms. -Discharged on home medication except atorvastatin -Reevaluate the need for Plavix.  Hypertension: Normotensive -Discharged on home medications.  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  A1c 7.9%.  CBG in upper 100s. -Discharged on home metformin and Actos. -He may benefit from newer agents with cardiovascular  and weight benefit once he fully recovers from pancreatitis.  Morbid obesity: BMI inaccurate in chart -Encourage lifestyle change to lose weight  Left nephrolithiasis: Incidental finding.  No hydronephrosis  OSA on CPAP -Continue nightly CPAP-compliant.  Discharge Instructions  Discharge Instructions    Diet - low sodium heart healthy   Complete by: As directed    Discharge instructions   Complete by: As directed    It has been a pleasure taking care of you! You were admitted with nausea, vomiting and abdominal pain likely due to acute pancreatitis (inflammation of your pancreas).  It is unclear what caused the inflammation.  However, your symptoms and the inflammation improved significantly to the point we think it is safe to let you go home and follow-up with your doctors. We have stopped your cholesterol medication due to some elevation in your liver numbers.  We also recommend avoiding Tylenol-containing products.  Please review your new medication list and the directions before you take your medications. Please call your primary care office and your gastroenterologist as soon as possible to schedule hospital follow-up visit in 1 to 2 weeks.  Take care,   Increase activity slowly   Complete by: As directed      Allergies as of 04/02/2019      Reactions   Iodine Swelling, Rash   Per wife   Shellfish Allergy Swelling, Rash   Per wife      Medication List    STOP taking these medications   atorvastatin 10 MG tablet Commonly known as: LIPITOR     TAKE these medications   CALCIUM-MAGNESIUM-ZINC PO Take 1 tablet by mouth daily.   candesartan 4 MG tablet Commonly known as: ATACAND Take 4 mg by mouth daily.   clopidogrel 75 MG tablet Commonly  known as: PLAVIX Take 75 mg by mouth daily.   famotidine 20 MG tablet Commonly known as: PEPCID Take 1 tablet (20 mg total) by mouth 2 (two) times daily.   Melatonin 5 MG Tabs Take 5 mg by mouth at bedtime.   metFORMIN  500 MG tablet Commonly known as: GLUCOPHAGE Take 500-1,000 mg by mouth See admin instructions. Takes 2 tablets every morning, take 1 tablet at noon, and 2 tablets in the evening   metoprolol succinate 25 MG 24 hr tablet Commonly known as: TOPROL-XL Take 25 mg by mouth daily.   montelukast 10 MG tablet Commonly known as: SINGULAIR Take 10 mg by mouth at bedtime.   ondansetron 4 MG tablet Commonly known as: ZOFRAN Take 1 tablet (4 mg total) by mouth every 6 (six) hours as needed for nausea.   oxyCODONE 5 MG immediate release tablet Commonly known as: Roxicodone Take 1 tablet (5 mg total) by mouth every 6 (six) hours as needed for up to 5 days.   pioglitazone 45 MG tablet Commonly known as: ACTOS Take 45 mg by mouth daily.   potassium chloride 10 MEQ CR capsule Commonly known as: MICRO-K Take 10 mEq by mouth daily.   senna 8.6 MG Tabs tablet Commonly known as: SENOKOT Take 1 tablet (8.6 mg total) by mouth daily as needed for mild constipation or moderate constipation.   vitamin E 400 UNIT capsule Take 400 Units by mouth daily.      Follow-up Information    Shelbie Ammons, MD. Schedule an appointment as soon as possible for a visit in 1 week(s).   Specialty: Internal Medicine Contact information: [address] [place] [address] [phone]        Lynann Bologna, MD. Schedule an appointment as soon as possible for a visit in 2 week(s).   Specialties: Gastroenterology, Internal Medicine Contact information: [address] [place] [address] [phone]           Consultations:  Gastroenterology  Procedures/Studies:  2D Echo: None  Ct Abdomen Pelvis Wo Contrast  Result Date: 03/28/2019 CLINICAL DATA:  Abdominal pain. Pancreatitis. Evaluate for choledocholithiasis EXAM: CT ABDOMEN AND PELVIS WITHOUT CONTRAST TECHNIQUE: Multidetector CT imaging of the abdomen and pelvis was performed following the standard protocol  without IV contrast. COMPARISON:  None. FINDINGS: Lower chest: Lung bases are clear. No effusions. Heart is normal size. Coronary artery calcifications noted. Hepatobiliary: No focal hepatic abnormality. Gallbladder unremarkable. No visible gallstones. Pancreas: Stranding around the pancreas compatible with acute pancreatitis. No focal abnormality or ductal dilatation. Mild fatty replacement. Spleen: No focal abnormality.  Normal size. Adrenals/Urinary Tract: Nonobstructing stone in the midpole of the left kidney. No hydronephrosis. No renal or adrenal mass. Urinary bladder unremarkable. Stomach/Bowel: Left colonic diverticulosis. No active diverticulitis. Appendix is normal. Small duodenal diverticulum. Stomach is decompressed. No evidence of bowel obstruction. Vascular/Lymphatic: No evidence of aneurysm or adenopathy. Aortic atherosclerosis. Reproductive: No visible focal abnormality. Other: No free fluid or free air. Musculoskeletal: No acute bony abnormality. IMPRESSION: Stranding around the pancreas compatible with acute pancreatitis. No visible gallstones. Left nephrolithiasis.  No hydronephrosis. Left colonic diverticulosis.  No active diverticulitis. Aortic atherosclerosis, coronary artery disease. Electronically Signed   By: Charlett Nose M.D.   On: 03/28/2019 00:47   US Abdomen Limited Ruq  Result Date: 03/28/2019 CLINICAL DATA:  Acute pancreatitis EXAM: ULTRASOUND ABDOMEN LIMITED RIGHT UPPER QUADRANT COMPARISON:  CT from earlier today FINDINGS: Gallbladder: No gallstones or wall thickening visualized. No sonographic Eulah Pont  sign noted by sonographer. Common bile duct: Diameter: 4 mm.  Where visualized, no filling defect. Liver: No focal lesion identified. Within normal limits in parenchymal echogenicity. Portal vein is patent on color Doppler imaging with normal direction of blood flow towards the liver. Sensitivity is degraded by body habitus. IMPRESSION: No acute finding or biliary calculus.  Electronically Signed   By: Marnee SpringJonathon  Watts M.D.   On: 03/28/2019 05:21      Subjective: No major events overnight of this morning.  Tolerated soft solid diet since yesterday afternoon.  Pain resolved.  Denies nausea or vomiting.  Feels ready to go home.  Discharge Exam: Vitals:   04/01/19 2018 04/02/19 0633  BP: (!) 149/63 136/68  Pulse: 80 81  Resp: 18 16  Temp: 98.5 F (36.9 C) 98.6 F (37 C)  SpO2: 96% 93%    GENERAL: No acute distress.  Appears well.  HEENT: MMM.  Vision and hearing grossly intact.  NECK: Supple.  No apparent JVD. LUNGS:  No IWOB. Good air movement bilaterally. HEART:  RRR. Heart sounds normal.  ABD: Bowel sounds present. Soft. Non tender.  Further exam limited by body habitus. MSK/EXT:  Moves all extremities. No apparent deformity. No edema bilaterally. SKIN: no apparent skin lesion or wound NEURO: Awake, alert and oriented appropriately.  No gross deficit.  PSYCH: Calm. Normal affect.   The results of significant diagnostics from this hospitalization (including imaging, microbiology, ancillary and laboratory) are listed below for reference.     Microbiology: Recent Results (from the past 240 hour(s))  SARS Coronavirus 2 (CEPHEID - Performed in [facility] hospital lab), Hosp Order     Status: None   Collection Time: 03/28/19  1:10 AM   Specimen: Nasopharyngeal Swab  Result Value Ref Range Status   SARS Coronavirus 2 NEGATIVE NEGATIVE Final    Comment: (NOTE) If result is NEGATIVE SARS-CoV-2 target nucleic acids are NOT DETECTED. The SARS-CoV-2 RNA is generally detectable in upper and lower  respiratory specimens during the acute phase of infection. The lowest  concentration of SARS-CoV-2 viral copies this assay can detect is 250  copies / mL. A negative result does not preclude SARS-CoV-2 infection  and should not be used as the sole basis for treatment or other  patient management decisions.  A negative result may occur with  improper  specimen collection / handling, submission of specimen other  than nasopharyngeal swab, presence of viral mutation(s) within the  areas targeted by this assay, and inadequate number of viral copies  (<250 copies / mL). A negative result must be combined with clinical  observations, patient history, and epidemiological information. If result is POSITIVE SARS-CoV-2 target nucleic acids are DETECTED. The SARS-CoV-2 RNA is generally detectable in upper and lower  respiratory specimens dur ing the acute phase of infection.  Positive  results are indicative of active infection with SARS-CoV-2.  Clinical  correlation with patient history and other diagnostic information is  necessary to determine patient infection status.  Positive results do  not rule out bacterial infection or co-infection with other viruses. If result is PRESUMPTIVE POSTIVE SARS-CoV-2 nucleic acids MAY BE PRESENT.   A presumptive positive result was obtained on the submitted specimen  and confirmed on repeat testing.  While 2019 novel coronavirus  (SARS-CoV-2) nucleic acids may be present in the submitted sample  additional confirmatory testing may be necessary for epidemiological  and / or clinical management purposes  to differentiate between  SARS-CoV-2 and other Sarbecovirus currently known to infect  humans.  If clinically indicated additional testing with an alternate test  methodology [phone]([id number]) is advised. The SARS-CoV-2 RNA is generally  detectable in upper and lower respiratory sp ecimens during the acute  phase of infection. The expected result is Negative. Fact Sheet for Patients:  BoilerBrush.com.cyhttps://www.fda.gov/media/136312/download Fact Sheet for Healthcare Providers: https://pope.com/https://www.fda.gov/media/136313/download This test is not yet approved or cleared by the [place] FDA and has been authorized for detection and/or diagnosis of SARS-CoV-2 by FDA under an Emergency Use Authorization (EUA).  This EUA will remain in  effect (meaning this test can be used) for the duration of the COVID-19 declaration under Section 564(b)(1) of the Act, 21 U.S.C. section 360bbb-3(b)(1), unless the authorization is terminated or revoked sooner. Performed at [facility], [address] [place], [place] [address]      Labs: BNP (last 3 results) No results for input(s): BNP in the last 8760 hours. Basic Metabolic Panel: Recent Labs  Lab 03/29/19 0326 03/30/19 0723 03/31/19 0911 04/01/19 0726 04/02/19 0701  NA 136 135 136 136 137  K 4.0 4.1 4.0 3.5 3.5  CL 100 100 102 102 100  CO2 26 24 25 25 25   GLUCOSE 142* 149* 136* 155* 179*  BUN 10 11 10 8  6*  CREATININE 1.10 1.08 0.97 0.91 1.00  CALCIUM 8.1* 7.5* 7.7* 7.9* 8.1*   Liver Function Tests: Recent Labs  Lab 03/27/19 1805 03/28/19 0525 03/29/19 0326 03/31/19 0911 04/02/19 0701  AST 49* 41 43* 34 70*  ALT 25 24 34 33 73*  ALKPHOS 61 48 54 64 103  BILITOT 0.7 1.3* 1.0 1.6* 1.3*  PROT 6.3* 5.8* 5.7* 5.7* 5.6*  ALBUMIN 3.8 3.5 3.3* 2.6* 2.4*   Recent Labs  Lab 03/27/19 1805 03/28/19 0525 03/29/19 0326 04/02/19 0701  LIPASE 1,495* 582* 82* 17   No results for input(s): AMMONIA in the last 168 hours. CBC: Recent Labs  Lab 03/28/19 0525 03/29/19 0326 03/30/19 0723 03/31/19 0911 04/01/19 0944 04/02/19 0701  WBC 10.1 13.2* 14.9* 13.0* 10.3 10.3  NEUTROABS 7.9* 10.1*  --   --   --   --   HGB 11.6* 11.1* 10.9* 10.6* 10.3* 10.5*  HCT 35.6* 34.2* 34.3* 33.2* 31.8* 32.3*  MCV 91.8 91.7 92.7 93.5 91.9 91.0  PLT 237 190 178 212 221 240   Cardiac Enzymes: No results for input(s): CKTOTAL, CKMB, CKMBINDEX, TROPONINI in the last 168 hours. BNP: Invalid input(s): POCBNP CBG: Recent Labs  Lab 04/01/19 0416 04/01/19 0801 04/01/19 1232 04/01/19 1601 04/01/19 2015  GLUCAP 153* 152* 130* 140* 165*   D-Dimer No results for input(s): DDIMER in the last 72 hours. Hgb A1c Recent Labs    04/01/19 0944  HGBA1C 7.9*   Lipid Profile  Recent Labs    04/01/19 1404  CHOL 135  HDL 39*  LDLCALC 76  TRIG 99  CHOLHDL 3.5   Thyroid function studies No results for input(s): TSH, T4TOTAL, T3FREE, THYROIDAB in the last 72 hours.  Invalid input(s): FREET3 Anemia work up No results for input(s): VITAMINB12, FOLATE, FERRITIN, TIBC, IRON, RETICCTPCT in the last 72 hours. Urinalysis    Component Value Date/Time   COLORURINE YELLOW 03/28/2019 0221   APPEARANCEUR CLEAR 03/28/2019 0221   LABSPEC 1.019 03/28/2019 0221   PHURINE 5.0 03/28/2019 0221   GLUCOSEU 150 (A) 03/28/2019 0221   HGBUR MODERATE (A) 03/28/2019 0221   BILIRUBINUR NEGATIVE 03/28/2019 0221   KETONESUR 5 (A) 03/28/2019 0221   PROTEINUR NEGATIVE 03/28/2019 0221   NITRITE NEGATIVE 03/28/2019  0221   LEUKOCYTESUR NEGATIVE 03/28/2019 0221   Sepsis Labs Invalid input(s): PROCALCITONIN,  WBC,  LACTICIDVEN   Time coordinating discharge: 35 minutes  SIGNED:  Almon Herculesaye T Bennie Scaff, MD  [facility] 04/02/2019, 8:52 AM  If 7PM-7AM, please contact night-coverage [email or web address] Password [email or web address]

## 2019-04-02 NOTE — Plan of Care (Signed)
  Problem: Pain Managment: Goal: General experience of comfort will improve Outcome: Progressing   Problem: Safety: Goal: Ability to remain free from injury will improve Outcome: Progressing   Problem: Skin Integrity: Goal: Risk for impaired skin integrity will decrease Outcome: Progressing   

## 2019-04-05 LAB — IGG 4

## 2019-04-20 ENCOUNTER — Encounter: Payer: Self-pay | Admitting: Gastroenterology

## 2019-05-03 ENCOUNTER — Ambulatory Visit: Payer: Medicare Other | Admitting: Gastroenterology

## 2019-06-01 ENCOUNTER — Encounter: Payer: Self-pay | Admitting: Gastroenterology

## 2019-06-01 ENCOUNTER — Other Ambulatory Visit: Payer: Self-pay

## 2019-06-01 ENCOUNTER — Ambulatory Visit (INDEPENDENT_AMBULATORY_CARE_PROVIDER_SITE_OTHER): Payer: Medicare Other | Admitting: Gastroenterology

## 2019-06-01 ENCOUNTER — Other Ambulatory Visit (INDEPENDENT_AMBULATORY_CARE_PROVIDER_SITE_OTHER): Payer: Medicare Other

## 2019-06-01 VITALS — BP 116/64 | HR 77 | Temp 98.4°F | Ht 68.0 in | Wt 282.0 lb

## 2019-06-01 DIAGNOSIS — K859 Acute pancreatitis without necrosis or infection, unspecified: Secondary | ICD-10-CM | POA: Diagnosis not present

## 2019-06-01 DIAGNOSIS — K760 Fatty (change of) liver, not elsewhere classified: Secondary | ICD-10-CM

## 2019-06-01 LAB — CBC WITH DIFFERENTIAL/PLATELET
Basophils Absolute: 0 10*3/uL (ref 0.0–0.1)
Basophils Relative: 0.6 % (ref 0.0–3.0)
Eosinophils Absolute: 0.3 10*3/uL (ref 0.0–0.7)
Eosinophils Relative: 4.6 % (ref 0.0–5.0)
HCT: 37.8 % — ABNORMAL LOW (ref 39.0–52.0)
Hemoglobin: 12.4 g/dL — ABNORMAL LOW (ref 13.0–17.0)
Lymphocytes Relative: 39.8 % (ref 12.0–46.0)
Lymphs Abs: 2.4 10*3/uL (ref 0.7–4.0)
MCHC: 32.8 g/dL (ref 30.0–36.0)
MCV: 91 fl (ref 78.0–100.0)
Monocytes Absolute: 0.5 10*3/uL (ref 0.1–1.0)
Monocytes Relative: 8.9 % (ref 3.0–12.0)
Neutro Abs: 2.8 10*3/uL (ref 1.4–7.7)
Neutrophils Relative %: 46.1 % (ref 43.0–77.0)
Platelets: 244 10*3/uL (ref 150.0–400.0)
RBC: 4.16 Mil/uL — ABNORMAL LOW (ref 4.22–5.81)
RDW: 14.5 % (ref 11.5–15.5)
WBC: 6.1 10*3/uL (ref 4.0–10.5)

## 2019-06-01 LAB — COMPREHENSIVE METABOLIC PANEL
ALT: 9 U/L (ref 0–53)
AST: 16 U/L (ref 0–37)
Albumin: 4.1 g/dL (ref 3.5–5.2)
Alkaline Phosphatase: 50 U/L (ref 39–117)
BUN: 18 mg/dL (ref 6–23)
CO2: 28 mEq/L (ref 19–32)
Calcium: 9.4 mg/dL (ref 8.4–10.5)
Chloride: 105 mEq/L (ref 96–112)
Creatinine, Ser: 1.21 mg/dL (ref 0.40–1.50)
GFR: 59.02 mL/min — ABNORMAL LOW (ref >60.00)
Glucose, Bld: 117 mg/dL — ABNORMAL HIGH (ref 70–99)
Potassium: 4.3 mEq/L (ref 3.5–5.1)
Sodium: 139 mEq/L (ref 135–145)
Total Bilirubin: 0.4 mg/dL (ref 0.2–1.2)
Total Protein: 6.9 g/dL (ref 6.0–8.3)

## 2019-06-01 LAB — LIPASE: Lipase: 10 U/L — ABNORMAL LOW (ref 11.0–59.0)

## 2019-06-01 NOTE — Progress Notes (Signed)
Chief Complaint:   Referring Provider:  Bonnita Nasuti, MD      ASSESSMENT AND PLAN;   #1. H/O Acute pancreatitis. The etiology of pancreatitis is not clear.  It may represent idiopathic pancreatitis.  No alcohol, gallstones, trauma, cocaine use, use of medications associated with pancreatitis, hypertriglyceridemia, hypercalcemia, intake of any herbal medications or previous history of pancreatitis. H/O brother having an episode of pancreatitis in past. CT  02/2019: Mild interstitial pancreatitis.  No necrosis.  #2. Fatty liver with mildly abnormal LFTs.  Plan: -Check CBC, CMP, lipase IgG4, -Low fat diet. -Start exercising like walking 77min x every day. -Encouraged him to lose weight.  Aim is to reduce 6lb over 12 weeks. -Gas X with each meal for now. -FU in 12 weeks. -If he has any recurrent attacks, proceed with MRCP followed by endoscopic ultrasound.  He understands and would like to hold off on any further work-up at the present time.   HPI:    Adam Kim is a 73 y.o. male  Follow-up visit for acute pancreatitis Doing great. No abdominal pain. Has mild postprandial bloating. No nausea, vomiting, heartburn, regurgitation, odynophagia or dysphagia.  No significant diarrhea or constipation.  There is no melena or hematochezia. No unintentional weight loss. No jaundice dark urine or pale stools. Very pleased with the progress.  Stress test - OK   Past GI procedures: -Colonoscopy 09/2013-mild pancolonic diverticulosis.  Repeat in 10 years. -EGD 03/2005 normal. Past Medical History:  Diagnosis Date  . Benign prostatic hyperplasia with lower urinary tract symptoms 10/26/2017  . CAD (coronary artery disease), native coronary artery 10/25/2017  . Chest pain 10/26/2017  . Gastro-esophageal reflux disease without esophagitis 10/26/2017  . Generalized anxiety disorder 10/26/2017  . Hyperlipidemia   . Hypertension 10/26/2017  . Obesity 10/26/2017  . Obstructive sleep apnea  10/26/2017  . Old myocardial infarct 10/26/2017  . PTSD (post-traumatic stress disorder)   . Type 2 diabetes mellitus ([place]) 10/26/2017  . Vitamin B 12 deficiency 10/26/2017  . Vitamin D deficiency 10/26/2017    Past Surgical History:  Procedure Laterality Date  . CARDIAC CATHETERIZATION    . COLONOSCOPY  10/02/2013   Mild pancolonic diverticulosis. Small internal hemorrhoids. Otherwise normal colonoscopy.   . CORONARY ANGIOPLASTY    . ESOPHAGOGASTRODUODENOSCOPY  03/30/2005   Normal EGD.   . TONSILLECTOMY AND ADENOIDECTOMY      Family History  Problem Relation Age of Onset  . Diabetes Mother     Social History   Tobacco Use  . Smoking status: Never Smoker  . Smokeless tobacco: Never Used  Substance Use Topics  . Alcohol use: No    Frequency: Never  . Drug use: No    Current Outpatient Medications  Medication Sig Dispense Refill  . CALCIUM-MAGNESIUM-ZINC PO Take 1 tablet by mouth daily.    . candesartan (ATACAND) 4 MG tablet Take 4 mg by mouth daily.    . clopidogrel (PLAVIX) 75 MG tablet Take 75 mg by mouth daily.    . famotidine (PEPCID) 20 MG tablet Take 1 tablet (20 mg total) by mouth 2 (two) times daily. 60 tablet 1  . Insulin Degludec (TRESIBA) 100 UNIT/ML SOLN Inject 14 Units into the skin daily with breakfast.    . Melatonin 5 MG TABS Take 5 mg by mouth at bedtime.    . metFORMIN (GLUCOPHAGE) 500 MG tablet Take 500 mg by mouth 2 (two) times daily.     . metoprolol succinate (TOPROL-XL) 25 MG 24 hr tablet  Take 25 mg by mouth daily.    . montelukast (SINGULAIR) 10 MG tablet Take 10 mg by mouth daily.     . pioglitazone (ACTOS) 45 MG tablet Take 45 mg by mouth daily.    . potassium chloride (MICRO-K) 10 MEQ CR capsule Take 10 mEq by mouth daily.    . vitamin E 400 UNIT capsule Take 400 Units by mouth daily.    . nitroGLYCERIN (NITROSTAT) 0.4 MG SL tablet as needed.    . ondansetron (ZOFRAN) 4 MG tablet Take 1 tablet (4 mg total) by mouth every 6 (six) hours as needed  for nausea. (Patient not taking: Reported on 06/01/2019) 20 tablet 0   No current facility-administered medications for this visit.     Allergies  Allergen Reactions  . Iodine Swelling and Rash    Per wife  . Shellfish Allergy Swelling and Rash    Per wife     Review of Systems:  Constitutional: Denies fever, chills, diaphoresis, appetite change and fatigue.  HEENT: Denies photophobia, eye pain, redness, hearing loss, ear pain, congestion, sore throat, rhinorrhea, sneezing, mouth sores, neck pain, neck stiffness and tinnitus.   Respiratory: Denies SOB, DOE, cough, chest tightness,  and wheezing.   Cardiovascular: Denies chest pain, palpitations and leg swelling.  Genitourinary: Denies dysuria, urgency, frequency, hematuria, flank pain and difficulty urinating.  Musculoskeletal: Denies myalgias, back pain, joint swelling, arthralgias and gait problem.  Skin: No rash.  Neurological: Denies dizziness, seizures, syncope, weakness, light-headedness, numbness and headaches.  Hematological: Denies adenopathy. Easy bruising, personal or family bleeding history  Psychiatric/Behavioral: No anxiety or depression     Physical Exam:    BP 116/64   Pulse 77   Temp 98.4 F (36.9 C)   Ht 5\' 8"  (1.727 m)   Wt 282 lb (127.9 kg)   BMI 42.88 kg/m  Filed Weights   06/01/19 1334  Weight: 282 lb (127.9 kg)   Constitutional:  Well-developed, in no acute distress. Psychiatric: Normal mood and affect. Behavior is normal. HEENT: Pupils normal.  Conjunctivae are normal. No scleral icterus. Neck supple.  Cardiovascular: Normal rate, regular rhythm. No edema Pulmonary/chest: Effort normal and breath sounds normal. No wheezing, rales or rhonchi. Abdominal: Soft, nondistended. Nontender. Bowel sounds active throughout. There are no masses palpable. No hepatomegaly. Rectal:  defered Neurological: Alert and oriented to person place and time. Skin: Skin is warm and dry. No rashes noted.  Data  Reviewed: I have personally reviewed following labs and imaging studies  CBC: CBC Latest Ref Rng & Units 04/02/2019 04/01/2019 03/31/2019  WBC 4.0 - 10.5 K/uL 10.3 10.3 13.0(H)  Hemoglobin 13.0 - 17.0 g/dL 10.5(L) 10.3(L) 10.6(L)  Hematocrit 39.0 - 52.0 % 32.3(L) 31.8(L) 33.2(L)  Platelets 150 - 400 K/uL 240 221 212    CMP: CMP Latest Ref Rng & Units 04/02/2019 04/01/2019 03/31/2019  Glucose 70 - 99 mg/dL 161(W179(H) 960(A155(H) 540(J136(H)  BUN 8 - 23 mg/dL 6(L) 8 10  Creatinine 8.110.61 - 1.24 mg/dL 9.141.00 7.820.91 9.560.97  Sodium 135 - 145 mmol/L 137 136 136  Potassium 3.5 - 5.1 mmol/L 3.5 3.5 4.0  Chloride 98 - 111 mmol/L 100 102 102  CO2 22 - 32 mmol/L 25 25 25   Calcium 8.9 - 10.3 mg/dL 8.1(L) 7.9(L) 7.7(L)  Total Protein 6.5 - 8.1 g/dL 2.1(H5.6(L) - 5.7(L)  Total Bilirubin 0.3 - 1.2 mg/dL 0.8(M1.3(H) - 1.6(H)  Alkaline Phos 38 - 126 U/L 103 - 64  AST 15 - 41 U/L 70(H) - 34  ALT  0 - 44 U/L 73(H) - 33  25 minutes spent with the patient today. Greater than 50% was spent in counseling and coordination of care with the patient    Edman Circle, MD 06/01/2019, 1:56 PM  Cc: Galvin Proffer, MD

## 2019-06-01 NOTE — Patient Instructions (Addendum)
If you are age 71 or older, your body mass index should be between 23-30. Your Body mass index is 42.88 kg/m. If this is out of the aforementioned range listed, please consider follow up with your Primary Care Provider.  If you are age 60 or younger, your body mass index should be between 19-25. Your Body mass index is 42.88 kg/m. If this is out of the aformentioned range listed, please consider follow up with your Primary Care Provider.   To help prevent the possible spread of infection to our patients, communities, and staff; we will be implementing the following measures:  As of now we are not allowing any visitors/family members to accompany you to any upcoming appointments with [facility]. If you have any concerns about this please contact our office to discuss prior to the appointment.   Your provider has requested that you go to the basement level for lab work at our [place] location ([address] [place] [address]) . Press "B" on the elevator. The lab is located at the first door on the left as you exit the elevator. You may go at whatever time is convienent for you. The current hours of operations are Monday- Friday 7:30am-4:30pm.  Please call our office at [phone] to set up your 3 month follow up visit.  Thank you,  Dr. Jackquline Denmark

## 2019-06-04 LAB — IGG SUBCLASS 4: IgG Subclass 4: 34.1 mg/dL (ref 4.0–86.0)

## 2019-09-14 ENCOUNTER — Other Ambulatory Visit: Payer: Self-pay | Admitting: Sports Medicine

## 2019-09-14 ENCOUNTER — Ambulatory Visit (INDEPENDENT_AMBULATORY_CARE_PROVIDER_SITE_OTHER): Payer: Medicare Other

## 2019-09-14 ENCOUNTER — Ambulatory Visit (INDEPENDENT_AMBULATORY_CARE_PROVIDER_SITE_OTHER): Payer: Medicare Other | Admitting: Sports Medicine

## 2019-09-14 ENCOUNTER — Encounter: Payer: Self-pay | Admitting: Sports Medicine

## 2019-09-14 ENCOUNTER — Other Ambulatory Visit: Payer: Self-pay

## 2019-09-14 DIAGNOSIS — M79672 Pain in left foot: Secondary | ICD-10-CM | POA: Diagnosis not present

## 2019-09-14 DIAGNOSIS — M722 Plantar fascial fibromatosis: Secondary | ICD-10-CM

## 2019-09-14 DIAGNOSIS — E1100 Type 2 diabetes mellitus with hyperosmolarity without nonketotic hyperglycemic-hyperosmolar coma (NKHHC): Secondary | ICD-10-CM | POA: Diagnosis not present

## 2019-09-14 MED ORDER — TRIAMCINOLONE ACETONIDE 10 MG/ML IJ SUSP
10.0000 mg | Freq: Once | INTRAMUSCULAR | Status: AC
  Administered 2019-09-14: 10 mg

## 2019-09-14 NOTE — Patient Instructions (Signed)

## 2019-09-14 NOTE — Progress Notes (Signed)
Subjective: Adam Kim is a 73 y.o. male patient presents to office with complaint of moderate heel pain on the left. Patient admits to post static dyskinesia for 1 week in duration, 8-9/10 sharp pain to area. Patient has treated this problem with icing with no relief. Denies any other pedal complaints.   Review of Systems  All other systems reviewed and are negative.    Patient Active Problem List   Diagnosis Date Noted  . Acute pancreatitis 03/28/2019  . Bilateral leg edema 05/13/2018  . Hyperlipidemia 10/26/2017  . Type 2 diabetes mellitus (HCC) 10/26/2017  . Hypertension 10/26/2017  . Obesity 10/26/2017  . Chest pain 10/26/2017  . Obstructive sleep apnea 10/26/2017  . Generalized anxiety disorder 10/26/2017  . Old myocardial infarct 10/26/2017  . Gastro-esophageal reflux disease without esophagitis 10/26/2017  . Benign prostatic hyperplasia with lower urinary tract symptoms 10/26/2017  . Vitamin B 12 deficiency 10/26/2017  . Vitamin D deficiency 10/26/2017  . CAD (coronary artery disease), native coronary artery 10/25/2017    Current Outpatient Medications on File Prior to Visit  Medication Sig Dispense Refill  . diazepam (VALIUM) 5 MG tablet Take 5 mg by mouth every 6 (six) hours as needed for anxiety.    . fluticasone (FLONASE) 50 MCG/ACT nasal spray Place into both nostrils daily.    . furosemide (LASIX) 20 MG tablet Take 20 mg by mouth.    . lansoprazole (PREVACID) 30 MG capsule Take 30 mg by mouth daily at 12 noon.    . magnesium oxide (MAG-OX) 400 MG tablet Take 400 mg by mouth daily.    Marland Kitchen nystatin cream (MYCOSTATIN) Apply 1 application topically 2 (two) times daily.    . candesartan (ATACAND) 4 MG tablet Take 4 mg by mouth daily.    . clopidogrel (PLAVIX) 75 MG tablet Take 75 mg by mouth daily.    . Insulin Degludec (TRESIBA) 100 UNIT/ML SOLN Inject 14 Units into the skin daily with breakfast.    . metFORMIN (GLUCOPHAGE) 500 MG tablet Take 500 mg by mouth 2  (two) times daily.     . metoprolol succinate (TOPROL-XL) 25 MG 24 hr tablet Take 25 mg by mouth daily.    . montelukast (SINGULAIR) 10 MG tablet Take 10 mg by mouth daily.     . nitroGLYCERIN (NITROSTAT) 0.4 MG SL tablet as needed.    . pioglitazone (ACTOS) 45 MG tablet Take 45 mg by mouth daily.    . vitamin E 400 UNIT capsule Take 400 Units by mouth daily.     No current facility-administered medications on file prior to visit.    Allergies  Allergen Reactions  . Iodine Swelling and Rash    Per wife  . Shellfish Allergy Swelling and Rash    Per wife     Objective: Physical Exam General: The patient is alert and oriented x3 in no acute distress.  Dermatology: Skin is warm, dry and supple bilateral lower extremities. Nails 1-10 are normal. There is no erythema, edema, no eccymosis, no open lesions present. Integument is otherwise unremarkable.  Vascular: Dorsalis Pedis pulse and Posterior Tibial pulse are 1/4 bilateral. Capillary fill time is immediate to all digits.  Neurological: Grossly intact to light touch with an achilles reflex of +2/5 and a  negative Tinel's sign bilateral.  Musculoskeletal: Tenderness to palpation at the medial calcaneal tubercale and through the insertion of the plantar fascia on the left foot. No pain with compression of calcaneus bilateral. No pain with tuning fork to calcaneus  bilateral. No pain with calf compression bilateral. There is decreased Ankle joint range of motion bilateral. All other joints range of motion within normal limits bilateral. Strength 5/5 in all groups bilateral.   Gait: Unassisted, Antalgic avoid weight on Left heel   Xray, Left foot:  Normal osseous mineralization. Joint spaces preserved. No fracture/dislocation/boney destruction. Calcaneal spur present with mild thickening of plantar fascia. No other soft tissue abnormalities or radiopaque foreign bodies.   Assessment and Plan: Problem List Items Addressed This Visit       Endocrine   Type 2 diabetes mellitus ([place])    Other Visit Diagnoses    Plantar fasciitis, left    -  Primary   Relevant Medications   triamcinolone acetonide (KENALOG) 10 MG/ML injection 10 mg (Start on 09/14/2019 11:00 AM)   Pain of left heel          -Complete examination performed.  -Xrays reviewed -Discussed with patient in detail the condition of plantar fasciitis, how this occurs and general treatment options. Explained both conservative and surgical treatments.  -After oral consent and aseptic prep, injected a mixture containing 1 ml of 2%  plain lidocaine, 1 ml 0.5% plain marcaine, 0.5 ml of kenalog 10 and 0.5 ml of dexamethasone phosphate into left heel only 1/2 of medication was injected. Post-injection care discussed with patient.  -Recommended good supportive shoes and advised use of OTC insert. - Explained in detail the use of the fascial brace and night splint which was dispensed at today's visit. -Explained and dispensed to patient daily stretching exercises. -Recommend patient to ice affected area 1-2x daily. -Patient to return to office in 4 weeks for follow up or sooner if problems or questions arise.  Landis Martins, DPM

## 2019-09-20 ENCOUNTER — Other Ambulatory Visit: Payer: Self-pay | Admitting: Sports Medicine

## 2019-09-20 DIAGNOSIS — M722 Plantar fascial fibromatosis: Secondary | ICD-10-CM

## 2019-10-09 ENCOUNTER — Telehealth: Payer: Self-pay | Admitting: Sports Medicine

## 2019-10-09 ENCOUNTER — Telehealth: Payer: Self-pay | Admitting: Podiatry

## 2019-10-09 NOTE — Telephone Encounter (Signed)
Error due to wrong doctor

## 2019-10-09 NOTE — Telephone Encounter (Signed)
Called Pt back and advised him that we have the braces in stock, Pt was told to come in to the office at any time from 8-12 and 1-5pm to pick up brace

## 2019-10-09 NOTE — Telephone Encounter (Signed)
Pt called and canceled an appt for 1.14.2021 but asked if we got the brace in for him. He was in on 12.17 and they did not have the pts size for the plantar fasciitis brace. He would need a size large.

## 2019-10-12 ENCOUNTER — Ambulatory Visit: Payer: Medicare Other | Admitting: Sports Medicine

## 2019-10-24 ENCOUNTER — Ambulatory Visit: Payer: Medicare Other | Admitting: Cardiology

## 2020-02-07 ENCOUNTER — Encounter: Payer: Self-pay | Admitting: Sports Medicine

## 2020-02-07 ENCOUNTER — Ambulatory Visit (INDEPENDENT_AMBULATORY_CARE_PROVIDER_SITE_OTHER): Payer: Medicare Other | Admitting: Sports Medicine

## 2020-02-07 ENCOUNTER — Other Ambulatory Visit: Payer: Self-pay

## 2020-02-07 ENCOUNTER — Other Ambulatory Visit: Payer: Self-pay | Admitting: Sports Medicine

## 2020-02-07 DIAGNOSIS — E1100 Type 2 diabetes mellitus with hyperosmolarity without nonketotic hyperglycemic-hyperosmolar coma (NKHHC): Secondary | ICD-10-CM

## 2020-02-07 DIAGNOSIS — M722 Plantar fascial fibromatosis: Secondary | ICD-10-CM | POA: Diagnosis not present

## 2020-02-07 DIAGNOSIS — M79672 Pain in left foot: Secondary | ICD-10-CM

## 2020-02-07 MED ORDER — TRIAMCINOLONE ACETONIDE 10 MG/ML IJ SUSP
10.0000 mg | Freq: Once | INTRAMUSCULAR | Status: AC
  Administered 2020-02-07: 22:00:00 10 mg

## 2020-02-07 NOTE — Progress Notes (Signed)
Subjective: Adam Kim is a 74 y.o. male returns to office for follow up evaluation after Left heel injection for plantar fasciitis back in December reports that the pain is not as bad but is still present pain now 6 out of 10 worse with prolonged driving standing or walking.  Reports on yesterday he did a long drive and felt some pain on his left foot afterwards have been stretching icing and wearing the brace when he can remember but needs help to put it on. Patient denies any recent changes in medications or new problems since last visit.   Patient is diabetic with blood sugar not recorded and is on blood thinners.  Patient Active Problem List   Diagnosis Date Noted  . Acute pancreatitis 03/28/2019  . Bilateral leg edema 05/13/2018  . Hyperlipidemia 10/26/2017  . Type 2 diabetes mellitus ([place]) 10/26/2017  . Hypertension 10/26/2017  . Obesity 10/26/2017  . Chest pain 10/26/2017  . Obstructive sleep apnea 10/26/2017  . Generalized anxiety disorder 10/26/2017  . Old myocardial infarct 10/26/2017  . Gastro-esophageal reflux disease without esophagitis 10/26/2017  . Benign prostatic hyperplasia with lower urinary tract symptoms 10/26/2017  . Vitamin B 12 deficiency 10/26/2017  . Vitamin D deficiency 10/26/2017  . CAD (coronary artery disease), native coronary artery 10/25/2017    Current Outpatient Medications on File Prior to Visit  Medication Sig Dispense Refill  . candesartan (ATACAND) 4 MG tablet Take 4 mg by mouth daily.    . clopidogrel (PLAVIX) 75 MG tablet Take 75 mg by mouth daily.    . diazepam (VALIUM) 5 MG tablet Take 5 mg by mouth every 6 (six) hours as needed for anxiety.    . fluticasone (FLONASE) 50 MCG/ACT nasal spray Place into both nostrils daily.    . furosemide (LASIX) 20 MG tablet Take 20 mg by mouth.    . Insulin Degludec (TRESIBA) 100 UNIT/ML SOLN Inject 14 Units into the skin daily with breakfast.    . lansoprazole (PREVACID) 30 MG capsule Take 30 mg by  mouth daily at 12 noon.    . magnesium oxide (MAG-OX) 400 MG tablet Take 400 mg by mouth daily.    . metFORMIN (GLUCOPHAGE) 500 MG tablet Take 500 mg by mouth 2 (two) times daily.     . metoprolol succinate (TOPROL-XL) 25 MG 24 hr tablet Take 25 mg by mouth daily.    . montelukast (SINGULAIR) 10 MG tablet Take 10 mg by mouth daily.     . nitroGLYCERIN (NITROSTAT) 0.4 MG SL tablet as needed.    . nystatin cream (MYCOSTATIN) Apply 1 application topically 2 (two) times daily.    . pioglitazone (ACTOS) 45 MG tablet Take 45 mg by mouth daily.    . vitamin E 400 UNIT capsule Take 400 Units by mouth daily.     No current facility-administered medications on file prior to visit.    Allergies  Allergen Reactions  . Iodine Swelling and Rash    Per wife  . Shellfish Allergy Swelling and Rash    Per wife     Objective:   General:  Alert and oriented x 3, in no acute distress  Dermatology: Skin is warm, dry, and supple bilateral. Nails are within normal limits. There is no lower extremity erythema, no eccymosis, no open lesions present bilateral.   Vascular: Dorsalis Pedis and Posterior Tibial pedal pulses are 1/4 bilateral. + hair growth noted bilateral. Capillary Fill Time is 3 seconds in all digits. No varicosities, No edema bilateral  lower extremities.   Neurological: Sensation grossly intact to light touch bilateral.   Musculoskeletal: There is tenderness to palpation at the medial calcaneal tubercale and through the insertion of the plantar fascia on the Left foot. No pain with compression to calcaneus or application of tuning fork. There is decreased Ankle joint range of motion bilateral. All other jointsrange of motion  within normal limits bilateral. + Pes planus and bunion deformity noted.  Strength 5/5 bilateral.   Assessment and Plan: Problem List Items Addressed This Visit      Endocrine   Type 2 diabetes mellitus (HCC)    Other Visit Diagnoses    Plantar fasciitis, left    -   Primary   Relevant Medications   triamcinolone acetonide (KENALOG) 10 MG/ML injection 10 mg (Completed) (Start on 02/07/2020 10:00 PM)   Pain of left heel          -Complete examination performed.  -Patient declined new x-rays at today's visit -Re-Discussed with patient in detail the condition of plantar fasciitis, how this  occurs related to the foot type of the patient and general treatment options. - Patient opted for another injection today; After oral consent and aseptic prep, injected a mixture containing 1 ml of 1%plain lidocaine, 1 ml 0.5% plain marcaine, 0.5 ml of kenalog 10 and 0.5 ml of dexmethasone phosphate to left heel at area of most pain/trigger point injection. -Dispensed heel lifts bilateral -Continue with stretching, icing, good supportive shoes, and plantar fascial brace daily.  -Discussed long term care and reocurrence; will closely monitor; if fails to improve will consider other treatment modalities.  -Patient to return to office in 1 month for follow up or sooner if problems or questions arise.  Asencion Islam, DPM

## 2020-02-27 ENCOUNTER — Other Ambulatory Visit: Payer: Self-pay

## 2020-02-27 ENCOUNTER — Other Ambulatory Visit: Payer: Medicare Other | Admitting: Orthotics

## 2020-03-13 ENCOUNTER — Other Ambulatory Visit: Payer: Self-pay

## 2020-03-13 ENCOUNTER — Encounter: Payer: Self-pay | Admitting: Sports Medicine

## 2020-03-13 ENCOUNTER — Ambulatory Visit (INDEPENDENT_AMBULATORY_CARE_PROVIDER_SITE_OTHER): Payer: Medicare Other | Admitting: Sports Medicine

## 2020-03-13 DIAGNOSIS — M79672 Pain in left foot: Secondary | ICD-10-CM | POA: Diagnosis not present

## 2020-03-13 DIAGNOSIS — E1100 Type 2 diabetes mellitus with hyperosmolarity without nonketotic hyperglycemic-hyperosmolar coma (NKHHC): Secondary | ICD-10-CM | POA: Diagnosis not present

## 2020-03-13 DIAGNOSIS — M722 Plantar fascial fibromatosis: Secondary | ICD-10-CM | POA: Diagnosis not present

## 2020-03-13 NOTE — Progress Notes (Signed)
Subjective: Adam Kim is a 74 y.o. male returns to office for follow up evaluation after Left heel injection for plantar fasciitis, injection #2 administered 4 weeks ago. Patient states that the injection seems to help his pain is seems much better denies any swelling any warmth reports that it is okay and does not have any pain right now.  Patient denies any recent changes in medications or new problems since last visit.   Patient Active Problem List   Diagnosis Date Noted  . Acute pancreatitis 03/28/2019  . Bilateral leg edema 05/13/2018  . Hyperlipidemia 10/26/2017  . Type 2 diabetes mellitus ([place]) 10/26/2017  . Hypertension 10/26/2017  . Obesity 10/26/2017  . Chest pain 10/26/2017  . Obstructive sleep apnea 10/26/2017  . Generalized anxiety disorder 10/26/2017  . Old myocardial infarct 10/26/2017  . Gastro-esophageal reflux disease without esophagitis 10/26/2017  . Benign prostatic hyperplasia with lower urinary tract symptoms 10/26/2017  . Vitamin B 12 deficiency 10/26/2017  . Vitamin D deficiency 10/26/2017  . CAD (coronary artery disease), native coronary artery 10/25/2017    Current Outpatient Medications on File Prior to Visit  Medication Sig Dispense Refill  . candesartan (ATACAND) 4 MG tablet Take 4 mg by mouth daily.    . clopidogrel (PLAVIX) 75 MG tablet Take 75 mg by mouth daily.    . diazepam (VALIUM) 5 MG tablet Take 5 mg by mouth every 6 (six) hours as needed for anxiety.    . fluticasone (FLONASE) 50 MCG/ACT nasal spray Place into both nostrils daily.    . furosemide (LASIX) 20 MG tablet Take 20 mg by mouth.    . Insulin Degludec (TRESIBA) 100 UNIT/ML SOLN Inject 14 Units into the skin daily with breakfast.    . lansoprazole (PREVACID) 30 MG capsule Take 30 mg by mouth daily at 12 noon.    . magnesium oxide (MAG-OX) 400 MG tablet Take 400 mg by mouth daily.    . metFORMIN (GLUCOPHAGE) 500 MG tablet Take 500 mg by mouth 2 (two) times daily.     . metFORMIN  (GLUCOPHAGE-XR) 500 MG 24 hr tablet Take 500 mg by mouth 5 (five) times daily as needed.    . metoprolol succinate (TOPROL-XL) 25 MG 24 hr tablet Take 25 mg by mouth daily.    . montelukast (SINGULAIR) 10 MG tablet Take 10 mg by mouth daily.     . nitroGLYCERIN (NITROSTAT) 0.4 MG SL tablet as needed.    Marland Kitchen NOVOFINE 32G X 6 MM MISC     . nystatin cream (MYCOSTATIN) Apply 1 application topically 2 (two) times daily.    . pioglitazone (ACTOS) 45 MG tablet Take 45 mg by mouth daily.    . potassium chloride (MICRO-K) 10 MEQ CR capsule Take 10 mEq by mouth daily.    . vitamin E 400 UNIT capsule Take 400 Units by mouth daily.     No current facility-administered medications on file prior to visit.    Allergies  Allergen Reactions  . Iodine Swelling and Rash    Per wife  . Shellfish Allergy Swelling and Rash    Per wife     Objective:   General:  Alert and oriented x 3, in no acute distress  Dermatology: Skin is warm, dry, and supple bilateral. Nails are within normal limits. There is no lower extremity erythema, no eccymosis, no open lesions present bilateral.   Vascular: Dorsalis Pedis and Posterior Tibial pedal pulses are 1/4 bilateral. + hair growth noted bilateral. Capillary Fill Time is  3 seconds in all digits. No varicosities, No edema bilateral lower extremities.   Neurological: Gross sensation intact via light touch bilateral.  Musculoskeletal: There is no reproducible tenderness to palpation the medial calcaneal tubercle of the insertion of the plantar fascia on the left foot. No pain with compression to calcaneus or application of tuning fork. There is decreased Ankle joint range of motion bilateral. All other jointsrange of motion  within normal limits bilateral. Strength 5/5 bilateral.   Assessment and Plan: Problem List Items Addressed This Visit      Endocrine   Type 2 diabetes mellitus (HCC)   Relevant Medications   metFORMIN (GLUCOPHAGE-XR) 500 MG 24 hr tablet     Other Visit Diagnoses    Plantar fasciitis, left    -  Primary   Pain of left heel          -Complete examination performed.  -Previous x-rays reviewed. -Re-Discussed with patient in detail the condition of plantar fasciitis, how this  occurs related to the foot type of the patient and general treatment options. -Advised patient to continue with good supportive shoes and heel lifts and or over-the-counter insoles -Continue with stretching, icing, good supportive shoes daily to assist with preventing recurrence -Patient to return to office as needed or sooner if problems or questions arise.  Asencion Islam, DPM

## 2020-04-11 ENCOUNTER — Other Ambulatory Visit: Payer: Self-pay

## 2020-04-11 ENCOUNTER — Encounter: Payer: Self-pay | Admitting: Podiatry

## 2020-04-11 ENCOUNTER — Ambulatory Visit (INDEPENDENT_AMBULATORY_CARE_PROVIDER_SITE_OTHER): Payer: Medicare Other | Admitting: Podiatry

## 2020-04-11 DIAGNOSIS — L84 Corns and callosities: Secondary | ICD-10-CM

## 2020-04-11 DIAGNOSIS — E1100 Type 2 diabetes mellitus with hyperosmolarity without nonketotic hyperglycemic-hyperosmolar coma (NKHHC): Secondary | ICD-10-CM | POA: Diagnosis not present

## 2020-04-11 DIAGNOSIS — M79674 Pain in right toe(s): Secondary | ICD-10-CM

## 2020-04-11 DIAGNOSIS — M79675 Pain in left toe(s): Secondary | ICD-10-CM | POA: Diagnosis not present

## 2020-04-11 DIAGNOSIS — B351 Tinea unguium: Secondary | ICD-10-CM | POA: Diagnosis not present

## 2020-04-13 NOTE — Progress Notes (Addendum)
Subjective: Adam Kim presents today preventative diabetic foot care and painful callus(es) left foot and painful thick toenails that are difficult to trim. Pain interferes with ambulation. Aggravating factors include wearing enclosed shoe gear. Pain is relieved with periodic professional debridement.  [place], Myrene Galas, MD is patient's PCP. Last visit was one month ago per patient recall.  Blood sugar was 144 mg/dl this morning. He thinks his last A1c was 6.8%.  Past Medical History:  Diagnosis Date  . Benign prostatic hyperplasia with lower urinary tract symptoms 10/26/2017  . CAD (coronary artery disease), native coronary artery 10/25/2017  . Chest pain 10/26/2017  . Gastro-esophageal reflux disease without esophagitis 10/26/2017  . Generalized anxiety disorder 10/26/2017  . Hyperlipidemia   . Hypertension 10/26/2017  . Obesity 10/26/2017  . Obstructive sleep apnea 10/26/2017  . Old myocardial infarct 10/26/2017  . PTSD (post-traumatic stress disorder)   . Type 2 diabetes mellitus (HCC) 10/26/2017  . Vitamin B 12 deficiency 10/26/2017  . Vitamin D deficiency 10/26/2017     Patient Active Problem List   Diagnosis Date Noted  . Acute pancreatitis 03/28/2019  . Bilateral leg edema 05/13/2018  . Hyperlipidemia 10/26/2017  . Type 2 diabetes mellitus (HCC) 10/26/2017  . Hypertension 10/26/2017  . Obesity 10/26/2017  . Chest pain 10/26/2017  . Obstructive sleep apnea 10/26/2017  . Generalized anxiety disorder 10/26/2017  . Old myocardial infarct 10/26/2017  . Gastro-esophageal reflux disease without esophagitis 10/26/2017  . Benign prostatic hyperplasia with lower urinary tract symptoms 10/26/2017  . Vitamin B 12 deficiency 10/26/2017  . Vitamin D deficiency 10/26/2017  . CAD (coronary artery disease), native coronary artery 10/25/2017    Current Outpatient Medications on File Prior to Visit  Medication Sig Dispense Refill  . candesartan (ATACAND) 4 MG tablet Take 4 mg by mouth  daily.    . clopidogrel (PLAVIX) 75 MG tablet Take 75 mg by mouth daily.    . diazepam (VALIUM) 5 MG tablet Take 5 mg by mouth every 6 (six) hours as needed for anxiety.    . fluticasone (FLONASE) 50 MCG/ACT nasal spray Place into both nostrils daily.    . furosemide (LASIX) 20 MG tablet Take 20 mg by mouth.    . Insulin Degludec (TRESIBA) 100 UNIT/ML SOLN Inject 14 Units into the skin daily with breakfast.    . lansoprazole (PREVACID) 30 MG capsule Take 30 mg by mouth daily at 12 noon.    . magnesium oxide (MAG-OX) 400 MG tablet Take 400 mg by mouth daily.    . metFORMIN (GLUCOPHAGE-XR) 500 MG 24 hr tablet Take 500 mg by mouth 5 (five) times daily as needed.    . metoprolol succinate (TOPROL-XL) 25 MG 24 hr tablet Take 25 mg by mouth daily.    . montelukast (SINGULAIR) 10 MG tablet Take 10 mg by mouth daily.     . nitroGLYCERIN (NITROSTAT) 0.4 MG SL tablet as needed.    Marland Kitchen NOVOFINE 32G X 6 MM MISC     . nystatin cream (MYCOSTATIN) Apply 1 application topically 2 (two) times daily.    . pioglitazone (ACTOS) 45 MG tablet Take 45 mg by mouth daily.    . potassium chloride (MICRO-K) 10 MEQ CR capsule Take 10 mEq by mouth daily.    . vitamin E 400 UNIT capsule Take 400 Units by mouth daily.     No current facility-administered medications on file prior to visit.     Allergies  Allergen Reactions  . Iodine Swelling and Rash  Per wife  . Shellfish Allergy Swelling and Rash    Per wife     Objective: Adam Kim is a pleasant 74 y.o. Caucasian male morbidly obese in NAD. AAO x 3.  There were no vitals filed for this visit.  Vascular Examination: Capillary fill time to digits <3 seconds b/l lower extremities. Faintly palpable pedal pulses b/l. Pedal hair present. Lower extremity skin temperature gradient within normal limits. No pain with calf compression b/l. No edema noted b/l lower extremities.  Dermatological Examination: Pedal skin with normal turgor, texture and tone  bilaterally. No open wounds bilaterally. No interdigital macerations bilaterally. Toenails L hallux and R hallux elongated, discolored, dystrophic, thickened, and crumbly with subungual debris and tenderness to dorsal palpation. Hyperkeratotic lesion(s) submet head 5 left foot.  No erythema, no edema, no drainage, no flocculence.  Musculoskeletal: Normal muscle strength 5/5 to all lower extremity muscle groups bilaterally. No pain crepitus or joint limitation noted with ROM b/l. No gross bony deformities bilaterally.  Neurological Examination: Protective sensation intact 5/5 intact bilaterally with 10g monofilament b/l. Vibratory sensation intact b/l. Proprioception intact bilaterally.  Last A1c: No flowsheet data found.   Assessment: 1. Pain due to onychomycosis of toenails of both feet   2. Callus   3. Type 2 diabetes mellitus with hyperosmolarity without coma, unspecified whether long term insulin use (HCC)    Plan: -Examined patient. -No new findings. No new orders. -Continue diabetic foot care principles. -Toenails L hallux and R hallux debrided in length and girth without iatrogenic bleeding with sterile nail nipper and dremel. Recommended daily use of tea tree oil to both great toes once daily. -Callus(es) submet head 5 left foot pared utilizing sterile scalpel blade without complication or incident. Total number debrided =1. -Patient to report any pedal injuries to medical professional immediately. -Patient to continue soft, supportive shoe gear daily. -Patient/POA to call should there be question/concern in the interim.  Return in about 3 months (around 07/12/2020).  Freddie Breech, DPM

## 2020-04-16 ENCOUNTER — Telehealth: Payer: Self-pay | Admitting: Sports Medicine

## 2020-04-16 NOTE — Telephone Encounter (Signed)
Pt called and left message stating someone was to be contacting him about his diabetic shoes status.  I returned call and explained we are waiting on paperwork from pcp and it has been faxed multiple times. I also hand faxed it today.

## 2020-06-04 ENCOUNTER — Other Ambulatory Visit: Payer: Medicare Other

## 2020-06-11 ENCOUNTER — Other Ambulatory Visit: Payer: Self-pay

## 2020-06-11 ENCOUNTER — Other Ambulatory Visit: Payer: Medicare Other

## 2020-07-09 ENCOUNTER — Other Ambulatory Visit: Payer: Self-pay

## 2020-07-09 ENCOUNTER — Ambulatory Visit: Payer: Medicare Other | Admitting: Orthotics

## 2020-07-09 DIAGNOSIS — B351 Tinea unguium: Secondary | ICD-10-CM

## 2020-07-09 DIAGNOSIS — M79674 Pain in right toe(s): Secondary | ICD-10-CM

## 2020-07-09 DIAGNOSIS — E1100 Type 2 diabetes mellitus with hyperosmolarity without nonketotic hyperglycemic-hyperosmolar coma (NKHHC): Secondary | ICD-10-CM

## 2020-07-09 DIAGNOSIS — L84 Corns and callosities: Secondary | ICD-10-CM

## 2020-07-09 NOTE — Progress Notes (Signed)
Diabetic shoes too tight.  Reordered shoes to be delivered and patient seen in [place].   Also taking inserts to [place] to be trimmed down

## 2020-07-18 ENCOUNTER — Ambulatory Visit: Payer: Medicare Other | Admitting: Podiatry

## 2020-07-22 ENCOUNTER — Other Ambulatory Visit: Payer: Medicare Other | Admitting: Orthotics

## 2020-07-23 ENCOUNTER — Ambulatory Visit: Payer: Medicare Other | Admitting: Sports Medicine

## 2020-08-06 ENCOUNTER — Telehealth: Payer: Self-pay | Admitting: Podiatry

## 2020-08-06 NOTE — Telephone Encounter (Signed)
Pt called checking status of reordered diabetic shoes.   Upon checking they have not shipped yet and I told pt I would call when I received them.

## 2020-08-13 ENCOUNTER — Ambulatory Visit: Payer: Medicare Other | Admitting: Orthotics

## 2020-08-16 ENCOUNTER — Other Ambulatory Visit: Payer: Self-pay

## 2020-08-16 ENCOUNTER — Ambulatory Visit (INDEPENDENT_AMBULATORY_CARE_PROVIDER_SITE_OTHER): Payer: Medicare Other | Admitting: Orthotics

## 2020-08-16 DIAGNOSIS — E1100 Type 2 diabetes mellitus with hyperosmolarity without nonketotic hyperglycemic-hyperosmolar coma (NKHHC): Secondary | ICD-10-CM

## 2020-09-02 ENCOUNTER — Ambulatory Visit: Payer: Medicare Other | Admitting: Orthotics

## 2020-09-06 ENCOUNTER — Other Ambulatory Visit: Payer: Self-pay

## 2020-09-06 ENCOUNTER — Ambulatory Visit (INDEPENDENT_AMBULATORY_CARE_PROVIDER_SITE_OTHER): Payer: Medicare Other | Admitting: Orthotics

## 2020-09-06 DIAGNOSIS — E1140 Type 2 diabetes mellitus with diabetic neuropathy, unspecified: Secondary | ICD-10-CM

## 2020-09-06 DIAGNOSIS — M722 Plantar fascial fibromatosis: Secondary | ICD-10-CM | POA: Diagnosis not present

## 2020-09-06 DIAGNOSIS — M79672 Pain in left foot: Secondary | ICD-10-CM

## 2020-09-06 DIAGNOSIS — E1100 Type 2 diabetes mellitus with hyperosmolarity without nonketotic hyperglycemic-hyperosmolar coma (NKHHC): Secondary | ICD-10-CM

## 2020-09-06 DIAGNOSIS — L84 Corns and callosities: Secondary | ICD-10-CM | POA: Diagnosis not present

## 2020-09-06 NOTE — Progress Notes (Signed)

## 2021-04-01 NOTE — Progress Notes (Signed)
Patient in office today and seen by EJ with [facility]. Patient tried on diabetic shoes and was satisfied with the fit. Patient was educated on the break-in process at this time. Patient advised to call the office with any questions, comments, or concerns. Patient verbalized understanding.  

## 2021-11-26 ENCOUNTER — Ambulatory Visit (INDEPENDENT_AMBULATORY_CARE_PROVIDER_SITE_OTHER): Payer: Medicare Other | Admitting: Cardiovascular Disease

## 2021-11-26 ENCOUNTER — Encounter: Payer: Self-pay | Admitting: Cardiovascular Disease

## 2021-11-26 ENCOUNTER — Other Ambulatory Visit: Payer: Self-pay

## 2021-11-26 VITALS — BP 124/60 | HR 73 | Ht 67.0 in | Wt 287.2 lb

## 2021-11-26 DIAGNOSIS — E785 Hyperlipidemia, unspecified: Secondary | ICD-10-CM

## 2021-11-26 DIAGNOSIS — Z6841 Body Mass Index (BMI) 40.0 and over, adult: Secondary | ICD-10-CM

## 2021-11-26 DIAGNOSIS — I2510 Atherosclerotic heart disease of native coronary artery without angina pectoris: Secondary | ICD-10-CM | POA: Diagnosis not present

## 2021-11-26 NOTE — Assessment & Plan Note (Signed)
History of CAD status post myocardial infarction back in 2007 status post 2 stents placed at that time.  He had 2D echo and Myoview stress test performed in 2019 both of which were essentially normal.  He is on Plavix.  He denies chest pain. ?

## 2021-11-26 NOTE — Assessment & Plan Note (Signed)
History of obstructive sleep apnea on CPAP. 

## 2021-11-26 NOTE — Assessment & Plan Note (Addendum)
History of hyperlipidemia currently not on statin therapy because of liver issues.  He recently had lab work performed by his PCP which we will obtain.  Lipid profile performed 10/31/2021 revealed a total cholesterol 190, LDL 113 and HDL 54. ?

## 2021-11-26 NOTE — Assessment & Plan Note (Signed)
History of essential hypertension with blood pressure measured today in the office 124/60.  He is on Atacand and metoprolol. ?

## 2021-11-26 NOTE — Progress Notes (Addendum)
? ? ? ?12/03/2021 ?Dagoberto Reef   ?09/10/1946  ?[id number] ? ?Primary Physician Galvin Proffer, MD ?Primary Cardiologist: Runell Gess MD Nicholes Calamity, [place] ? ?HPI:  Adam Kim is a 76 y.o. moderately overweight married Caucasian male father 4 daughters, grandfather of 12 grandchildren referred by his primary care provider, Dr. Brent Bulla, to be established in my practice.  His previous cardiologist was Dr. Dulce Sellar in [place].  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  He brought in [place].  His risk factors include treated hypertension, diabetes and hyperlipidemia.  He did have a myocardial infarction in 2007 and had 2 stents placed at [facility].  He said no subsequent issues and remains on Plavix.  He did have a normal 2D echo and Myoview in 2019.  He has obstructive sleep apnea on CPAP.  He is trying to lose weight. ? ? ?Current Meds  ?Medication Sig  ? candesartan (ATACAND) 4 MG tablet Take 4 mg by mouth daily.  ? clopidogrel (PLAVIX) 75 MG tablet Take 75 mg by mouth daily.  ? fluticasone (FLONASE) 50 MCG/ACT nasal spray Place into both nostrils daily.  ? furosemide (LASIX) 20 MG tablet Take 20 mg by mouth.  ? Insulin Degludec (TRESIBA) 100 UNIT/ML SOLN Inject 14 Units into the skin daily with breakfast.  ? lansoprazole (PREVACID) 30 MG capsule Take 30 mg by mouth daily at 12 noon.  ? magnesium oxide (MAG-OX) 400 MG tablet Take 400 mg by mouth daily.  ? metFORMIN (GLUCOPHAGE-XR) 500 MG 24 hr tablet Take 500 mg by mouth 5 (five) times daily as needed.  ? metoprolol succinate (TOPROL-XL) 25 MG 24 hr tablet Take 25 mg by mouth daily.  ? montelukast (SINGULAIR) 10 MG tablet Take 10 mg by mouth daily.   ? nitroGLYCERIN (NITROSTAT) 0.4 MG SL tablet as needed.  ? NOVOFINE 32G X 6 MM MISC   ? nystatin cream (MYCOSTATIN) Apply 1 application topically 2 (two) times daily.  ? pioglitazone (ACTOS) 45 MG tablet Take 45 mg by mouth daily.  ? potassium chloride  (MICRO-K) 10 MEQ CR capsule Take 10 mEq by mouth daily.  ? vitamin E 400 UNIT capsule Take 400 Units by mouth daily.  ?  ? ?Allergies  ?Allergen Reactions  ? Iodine Swelling and Rash  ?  Per wife  ? Shellfish Allergy Swelling and Rash  ?  Per wife ?  ? ? ?Social History  ? ?Socioeconomic History  ? Marital status: Married  ?  Spouse name: Not on file  ? Number of children: Not on file  ? Years of education: Not on file  ? Highest education level: Not on file  ?Occupational History  ? Not on file  ?Tobacco Use  ? Smoking status: Never  ? Smokeless tobacco: Never  ?Vaping Use  ? Vaping Use: Never used  ?Substance and Sexual Activity  ? Alcohol use: No  ? Drug use: No  ? Sexual activity: Not on file  ?Other Topics Concern  ? Not on file  ?Social History Narrative  ? Not on file  ? ?Social Determinants of Health  ? ?Financial Resource Strain: Not on file  ?Food Insecurity: Not on file  ?Transportation Needs: Not on file  ?Physical Activity: Not on file  ?Stress: Not on file  ?Social Connections: Not on file  ?Intimate Partner Violence: Not on file  ?  ? ?Review of Systems: ?General: negative for chills, fever, night  sweats or weight changes.  ?Cardiovascular: negative for chest pain, dyspnea on exertion, edema, orthopnea, palpitations, paroxysmal nocturnal dyspnea or shortness of breath ?Dermatological: negative for rash ?Respiratory: negative for cough or wheezing ?Urologic: negative for hematuria ?Abdominal: negative for nausea, vomiting, diarrhea, bright red blood per rectum, melena, or hematemesis ?Neurologic: negative for visual changes, syncope, or dizziness ?All other systems reviewed and are otherwise negative except as noted above. ? ? ? ?Blood pressure 124/60, pulse 73, height 5\' 7"  (1.702 m), weight 287 lb 3.2 oz (130.3 kg), SpO2 98 %.  ?General appearance: alert and no distress ?Neck: no adenopathy, no carotid bruit, no JVD, supple, symmetrical, trachea midline, and thyroid not enlarged, symmetric, no  tenderness/mass/nodules ?Lungs: clear to auscultation bilaterally ?Heart: regular rate and rhythm, S1, S2 normal, no murmur, click, rub or gallop ?Extremities: extremities normal, atraumatic, no cyanosis or edema ?Pulses: 2+ and symmetric ?Skin: Skin color, texture, turgor normal. No rashes or lesions ?Neurologic: Grossly normal ? ?EKG normal sinus rhythm at 73 without ST or T wave changes.  I personally reviewed this EKG. ? ?ASSESSMENT AND PLAN:  ? ?CAD (coronary artery disease), native coronary artery ?History of CAD status post myocardial infarction back in 2007 status post 2 stents placed at that time.  He had 2D echo and Myoview stress test performed in 2019 both of which were essentially normal.  He is on Plavix.  He denies chest pain. ? ?Hyperlipidemia ?History of hyperlipidemia currently not on statin therapy because of liver issues.  He recently had lab work performed by his PCP which we will obtain.  Lipid profile performed 10/31/2021 revealed a total cholesterol 190, LDL 113 and HDL 54. ? ?Hypertension ?History of essential hypertension with blood pressure measured today in the office 124/60.  He is on Atacand and metoprolol. ? ?Obstructive sleep apnea ?History of obstructive sleep apnea on CPAP ? ? ? ? ?12/29/2021 MD FACP,FACC,FAHA, FSCAI ?12/03/2021 ?1:51 PM ?

## 2021-11-26 NOTE — Patient Instructions (Signed)

## 2022-01-09 ENCOUNTER — Other Ambulatory Visit: Payer: Self-pay | Admitting: Family Medicine

## 2022-01-09 DIAGNOSIS — R310 Gross hematuria: Secondary | ICD-10-CM

## 2022-01-13 ENCOUNTER — Ambulatory Visit: Payer: Medicare Other | Admitting: Cardiology

## 2022-02-05 ENCOUNTER — Ambulatory Visit
Admission: RE | Admit: 2022-02-05 | Discharge: 2022-02-05 | Disposition: A | Discharge status: Home / Self Care | Payer: Medicare Other | Source: Ambulatory Visit | Attending: Family Medicine | Admitting: Family Medicine

## 2022-02-05 ENCOUNTER — Other Ambulatory Visit: Payer: Self-pay | Admitting: Family Medicine

## 2022-02-05 DIAGNOSIS — R310 Gross hematuria: Secondary | ICD-10-CM

## 2022-02-05 MED ORDER — IOPAMIDOL (ISOVUE-300) INJECTION 61%
125.0000 mL | Freq: Once | INTRAVENOUS | Status: AC | PRN
  Administered 2022-02-05: 125 mL via INTRAVENOUS

## 2022-02-06 NOTE — Progress Notes (Signed)
Test 5/12 ?

## 2022-02-14 ENCOUNTER — Encounter (HOSPITAL_COMMUNITY): Payer: Self-pay | Admitting: Emergency Medicine

## 2022-02-14 ENCOUNTER — Other Ambulatory Visit: Payer: Self-pay

## 2022-02-14 ENCOUNTER — Emergency Department (HOSPITAL_COMMUNITY)
Admission: EM | Admit: 2022-02-14 | Discharge: 2022-02-14 | Discharge status: Against Medical Advice | Payer: Medicare Other | Attending: Student | Admitting: Student

## 2022-02-14 DIAGNOSIS — R110 Nausea: Secondary | ICD-10-CM | POA: Diagnosis not present

## 2022-02-14 DIAGNOSIS — Z5321 Procedure and treatment not carried out due to patient leaving prior to being seen by health care provider: Secondary | ICD-10-CM | POA: Insufficient documentation

## 2022-02-14 DIAGNOSIS — R531 Weakness: Secondary | ICD-10-CM | POA: Insufficient documentation

## 2022-02-14 LAB — CBC
HCT: 41.4 % (ref 39.0–52.0)
Hemoglobin: 13.9 g/dL (ref 13.0–17.0)
MCH: 32.3 pg (ref 26.0–34.0)
MCHC: 33.6 g/dL (ref 30.0–36.0)
MCV: 96.3 fL (ref 80.0–100.0)
Platelets: 246 10*3/uL (ref 150–400)
RBC: 4.3 MIL/uL (ref 4.22–5.81)
RDW: 14.1 % (ref 11.5–15.5)
WBC: 8.2 10*3/uL (ref 4.0–10.5)
nRBC: 0 % (ref 0.0–0.2)

## 2022-02-14 LAB — BASIC METABOLIC PANEL
Anion gap: 9 (ref 5–15)
BUN: 19 mg/dL (ref 8–23)
CO2: 27 mmol/L (ref 22–32)
Calcium: 9.1 mg/dL (ref 8.9–10.3)
Chloride: 106 mmol/L (ref 98–111)
Creatinine, Ser: 1.33 mg/dL — ABNORMAL HIGH (ref 0.61–1.24)
GFR, Estimated: 56 mL/min — ABNORMAL LOW (ref >60)
Glucose, Bld: 172 mg/dL — ABNORMAL HIGH (ref 70–99)
Potassium: 4.8 mmol/L (ref 3.5–5.1)
Sodium: 142 mmol/L (ref 135–145)

## 2022-02-14 NOTE — ED Notes (Signed)
Pt states that the wait is to long and they will obtain their results on MyChart

## 2022-02-14 NOTE — ED Triage Notes (Signed)
Pt presents with increasing weakness x 1 week. Worse since yesterday.  States his BP at home was reading in the 90s systolic so he felt he needed evaluation.  Is currently on doxycycline for diverticulitis and has had less po intake d/t feelings of nausea he attributes to the anbx.

## 2022-08-12 ENCOUNTER — Telehealth: Payer: Self-pay

## 2022-08-12 NOTE — Telephone Encounter (Signed)
   Pre-operative Risk Assessment    Patient Name: Cael Worth  DOB: 07/25/1946 MRN: [id number]      Request for Surgical Clearance    Procedure:   Colonoscopy, EGD  Date of Surgery:  Clearance TBD                                 Surgeon:  n/a  Surgeon's Group or Practice Name:  [facility] Phone number:  [phone] Fax number:  [phone]   Type of Clearance Requested:   - Medical  - Pharmacy:  Hold Clopidogrel (Plavix) 5 days   Type of Anesthesia:  General    Additional requests/questions:    Delphia Grates   08/12/2022, 10:24 AM

## 2022-08-13 ENCOUNTER — Telehealth: Payer: Self-pay | Admitting: *Deleted

## 2022-08-13 NOTE — Telephone Encounter (Signed)
   Name: Adam Kim  DOB: November 16, 1945  MRN: [id number]  Primary Cardiologist: None   Preoperative team, please contact this patient and set up a phone call appointment for further preoperative risk assessment. Please obtain consent and complete medication review. Thank you for your help.  I confirm that guidance regarding antiplatelet and oral anticoagulation therapy has been completed and, if necessary, noted below.  Per office protocol, if patient is without any new symptoms or concerns at the time of their virtual visit, he may hold Plavix for 5 days prior to procedure. Please resume Plavix as soon as possible postprocedure, at the discretion of the surgeon.   Joylene Grapes, NP 08/13/2022, 10:56 AM [facility]

## 2022-08-13 NOTE — Telephone Encounter (Signed)
Pt has been scheduled for a tele-visit, 08/18/22 10:20.  Consent on file / medications reconciled.

## 2022-08-13 NOTE — Telephone Encounter (Signed)
Pt has been scheduled for a tele-visit, 08/18/22 10:20.  Consent on file / medications reconciled.     Patient Consent for Virtual Visit        [name] has provided verbal consent on 08/13/2022 for a virtual visit (video or telephone).   CONSENT FOR VIRTUAL VISIT FOR:  Adam Kim  By participating in this virtual visit I agree to the following:  I hereby voluntarily request, consent and authorize [facility] and its employed or contracted physicians, physician assistants, nurse practitioners or other licensed health care professionals (the Practitioner), to provide me with telemedicine health care services (the "Services") as deemed necessary by the treating Practitioner. I acknowledge and consent to receive the Services by the Practitioner via telemedicine. I understand that the telemedicine visit will involve communicating with the Practitioner through live audiovisual communication technology and the disclosure of certain medical information by electronic transmission. I acknowledge that I have been given the opportunity to request an in-person assessment or other available alternative prior to the telemedicine visit and am voluntarily participating in the telemedicine visit.  I understand that I have the right to withhold or withdraw my consent to the use of telemedicine in the course of my care at any time, without affecting my right to future care or treatment, and that the Practitioner or I may terminate the telemedicine visit at any time. I understand that I have the right to inspect all information obtained and/or recorded in the course of the telemedicine visit and may receive copies of available information for a reasonable fee.  I understand that some of the potential risks of receiving the Services via telemedicine include:  Delay or interruption in medical evaluation due to technological equipment failure or disruption; Information transmitted may not be  sufficient (e.g. poor resolution of images) to allow for appropriate medical decision making by the Practitioner; and/or  In rare instances, security protocols could fail, causing a breach of personal health information.  Furthermore, I acknowledge that it is my responsibility to provide information about my medical history, conditions and care that is complete and accurate to the best of my ability. I acknowledge that Practitioner's advice, recommendations, and/or decision may be based on factors not within their control, such as incomplete or inaccurate data provided by me or distortions of diagnostic images or specimens that may result from electronic transmissions. I understand that the practice of medicine is not an exact science and that Practitioner makes no warranties or guarantees regarding treatment outcomes. I acknowledge that a copy of this consent can be made available to me via my patient portal [facility] MyChart), or I can request a printed copy by calling the office of [facility].    I understand that my insurance will be billed for this visit.   I have read or had this consent read to me. I understand the contents of this consent, which adequately explains the benefits and risks of the Services being provided via telemedicine.  I have been provided ample opportunity to ask questions regarding this consent and the Services and have had my questions answered to my satisfaction. I give my informed consent for the services to be provided through the use of telemedicine in my medical care

## 2022-08-18 ENCOUNTER — Ambulatory Visit: Payer: Medicare Other | Attending: Internal Medicine | Admitting: General Practice

## 2022-08-18 DIAGNOSIS — Z01810 Encounter for preprocedural cardiovascular examination: Secondary | ICD-10-CM | POA: Diagnosis not present

## 2022-08-18 NOTE — Progress Notes (Signed)
Virtual Visit via Telephone Note   Because of Adam Kim's co-morbid illnesses, he is at least at moderate risk for complications without adequate follow up.  This format is felt to be most appropriate for this patient at this time.  The patient did not have access to video technology/had technical difficulties with video requiring transitioning to audio format only (telephone).  All issues noted in this document were discussed and addressed.  No physical exam could be performed with this format.  Please refer to the patient's chart for his consent to telehealth for [facility].  Evaluation Performed:  Preoperative cardiovascular risk assessment _____________   Date:  08/18/2022   Patient ID:  Adam Kim, DOB Jan 27, 1946, MRN [id number] Patient Location:  Home Provider location:   Office  Primary Care Provider:  Galvin Proffer, MD Primary Cardiologist:  None  Chief Complaint / Patient Profile   76 y.o. y/o male with a h/o coronary artery disease, HLD, HTN, MI who is pending EGD/colonoscopy and presents today for telephonic preoperative cardiovascular risk assessment.  Past Medical History    Past Medical History:  Diagnosis Date   Benign prostatic hyperplasia with lower urinary tract symptoms 10/26/2017   CAD (coronary artery disease), native coronary artery 10/25/2017   Chest pain 10/26/2017   Gastro-esophageal reflux disease without esophagitis 10/26/2017   Generalized anxiety disorder 10/26/2017   Hyperlipidemia    Hypertension 10/26/2017   Obesity 10/26/2017   Obstructive sleep apnea 10/26/2017   Old myocardial infarct 10/26/2017   PTSD (post-traumatic stress disorder)    Type 2 diabetes mellitus (HCC) 10/26/2017   Vitamin B 12 deficiency 10/26/2017   Vitamin D deficiency 10/26/2017   Past Surgical History:  Procedure Laterality Date   CARDIAC CATHETERIZATION     COLONOSCOPY  10/02/2013   Mild pancolonic diverticulosis. Small internal hemorrhoids. Otherwise  normal colonoscopy.    CORONARY ANGIOPLASTY     ESOPHAGOGASTRODUODENOSCOPY  03/30/2005   Normal EGD.    TONSILLECTOMY AND ADENOIDECTOMY      Allergies  Allergies  Allergen Reactions   Iodine Swelling and Rash    Per wife   Shellfish Allergy Swelling and Rash    Per wife     History of Present Illness    Adam Kim is a 76 y.o. male who presents via audio/video conferencing for a telehealth visit today.  Pt was last seen in cardiology clinic on 11/26/2021 by Dr.Berry.  At that time Adam Kim was doing well .  The patient is now pending procedure as outlined above. Since his last visit, he remains stable from a cardiac standpoint.  Today he denies chest pain, shortness of breath, lower extremity edema, fatigue, palpitations, melena, hematuria, hemoptysis, diaphoresis, weakness, presyncope, syncope, orthopnea, and PND.    Home Medications    Prior to Admission medications   Medication Sig Start Date End Date Taking? Authorizing Provider  cholecalciferol (VITAMIN D3) 25 MCG (1000 UNIT) tablet Take 5,000 Units by mouth daily.    [provider]  clopidogrel (PLAVIX) 75 MG tablet Take 75 mg by mouth daily.    [provider]  hydrOXYzine (ATARAX) 25 MG tablet Take 25 mg by mouth at bedtime as needed.    [provider]  Insulin Glargine (TOUJEO MAX SOLOSTAR Pyote) Inject 20 Units into the skin every morning.    [provider]  lansoprazole (PREVACID) 30 MG capsule Take 30 mg by mouth daily at 12 noon.    [provider]  metFORMIN (GLUCOPHAGE-XR) 500 MG 24  hr tablet Take 500 mg by mouth 5 (five) times daily as needed. 02/22/20   [provider]  metoprolol succinate (TOPROL-XL) 25 MG 24 hr tablet Take 25 mg by mouth daily.    [provider]  nitroGLYCERIN (NITROSTAT) 0.4 MG SL tablet as needed. 01/19/19   [provider]  NOVOFINE 32G X 6 MM MISC  01/01/20   [provider]  nystatin cream  (MYCOSTATIN) Apply 1 application topically 2 (two) times daily.    [provider]  pioglitazone (ACTOS) 45 MG tablet Take 45 mg by mouth daily.    [provider]  potassium chloride (MICRO-K) 10 MEQ CR capsule Take 10 mEq by mouth daily. 03/08/20   [provider]  rosuvastatin (CRESTOR) 5 MG tablet Take 5 mg by mouth daily.    [provider]    Physical Exam    Vital Signs:  Adam Kim does not have vital signs available for review today.  Given telephonic nature of communication, physical exam is limited. AAOx3. NAD. Normal affect.  Speech and respirations are unlabored.  Accessory Clinical Findings    None  Assessment & Plan    1.  Preoperative Cardiovascular Risk Assessment: Colonoscopy, EGD, [facility]  Primary Cardiologist: Dr. Nanetta Batty  Chart reviewed as part of pre-operative protocol coverage. Given past medical history and time since last visit, based on ACC/AHA guidelines, Adam Kim would be at acceptable risk for the planned procedure without further cardiovascular testing.   He may hold Plavix for 5 days prior to procedure. Please resume Plavix as soon as possible postprocedure, at the discretion of the surgeon.   Patient was advised that if he develops new symptoms prior to surgery to contact our office to arrange a follow-up appointment.  He verbalized understanding.  I will route this recommendation to the requesting party via Epic fax function and remove from pre-op pool.       Time:   Today, I have spent 8 minutes with the patient with telehealth technology discussing medical history, symptoms, and management plan.  Prior to his phone evaluation I spent greater than 10 minutes reviewing his past medical history and cardiac medications.   Adam Asters, NP  08/18/2022, 7:21 AM

## 2022-09-26 IMAGING — CR DG ABDOMEN 1V
2 series · 2 of 2 positions shown · non-contrast
Comparison: CT dated March 28, 2019

CLINICAL DATA: gross hematuria

EXAM:
ABDOMEN - 1 VIEW

[t abdomen supine (1 of 2)]
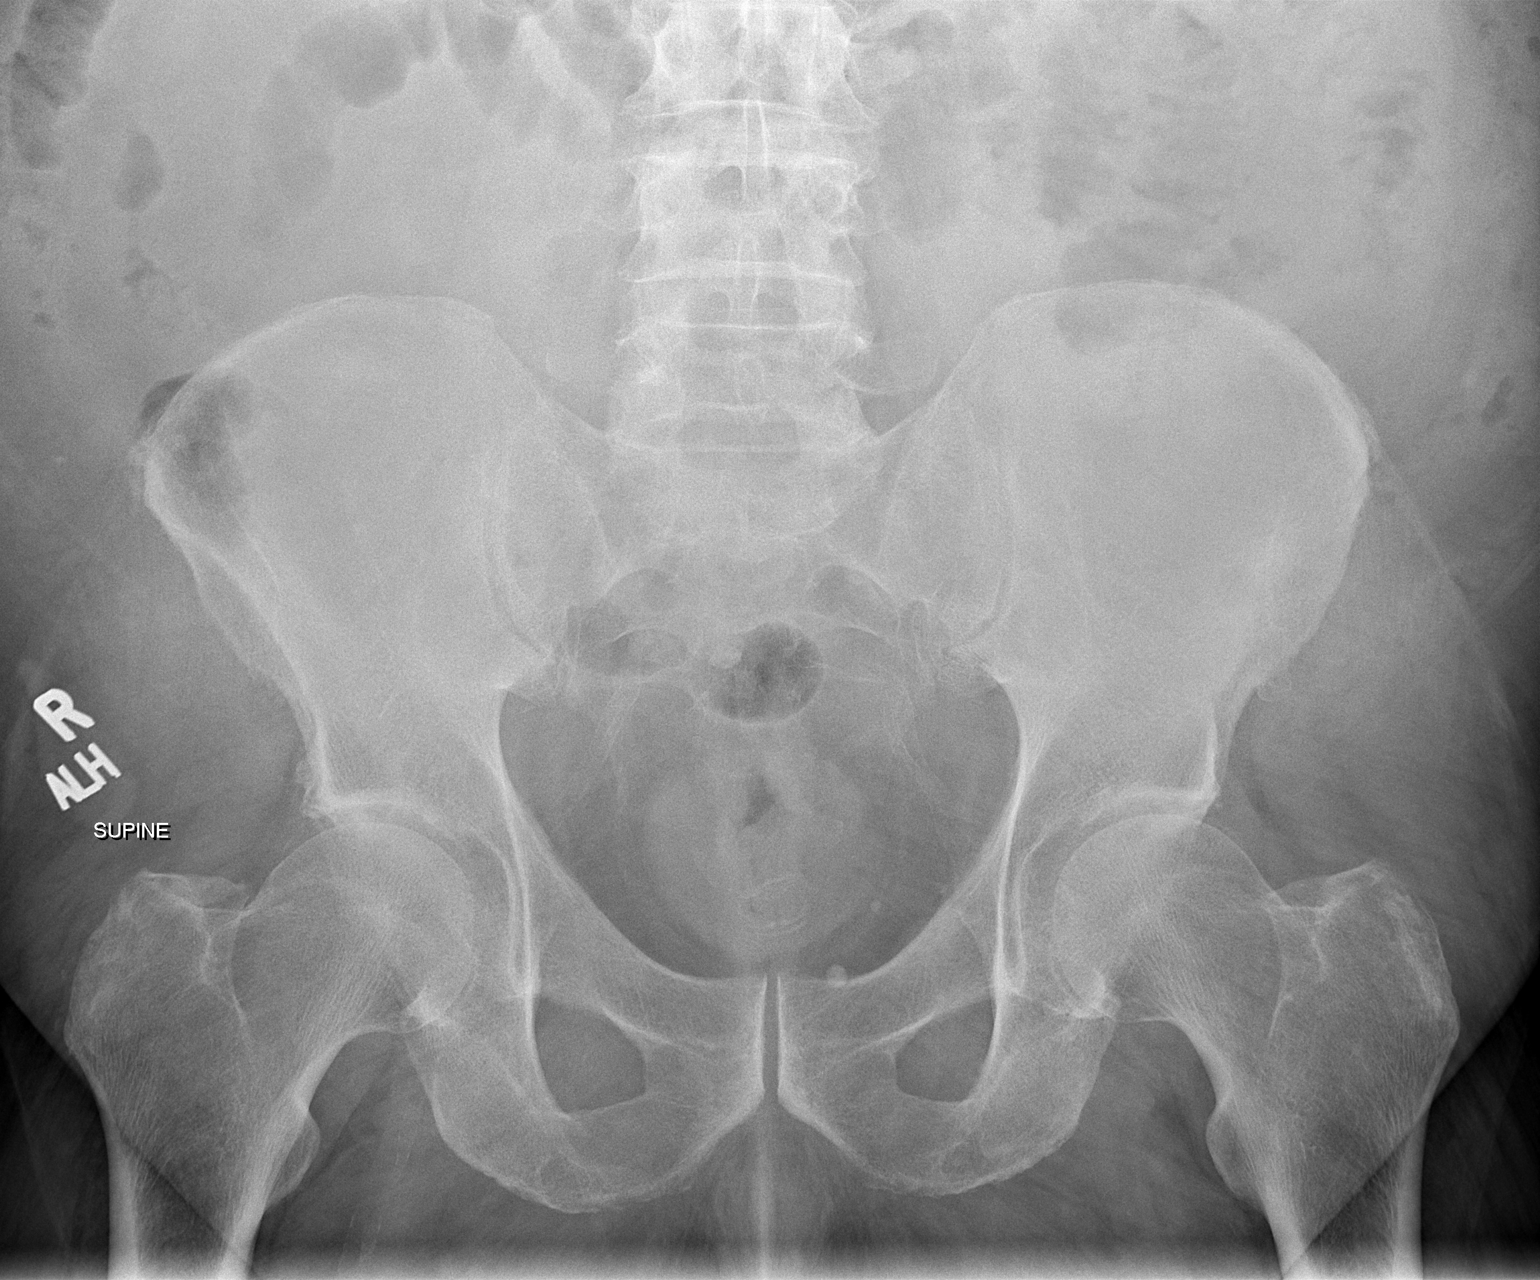

[t abdomen supine (2 of 2)]
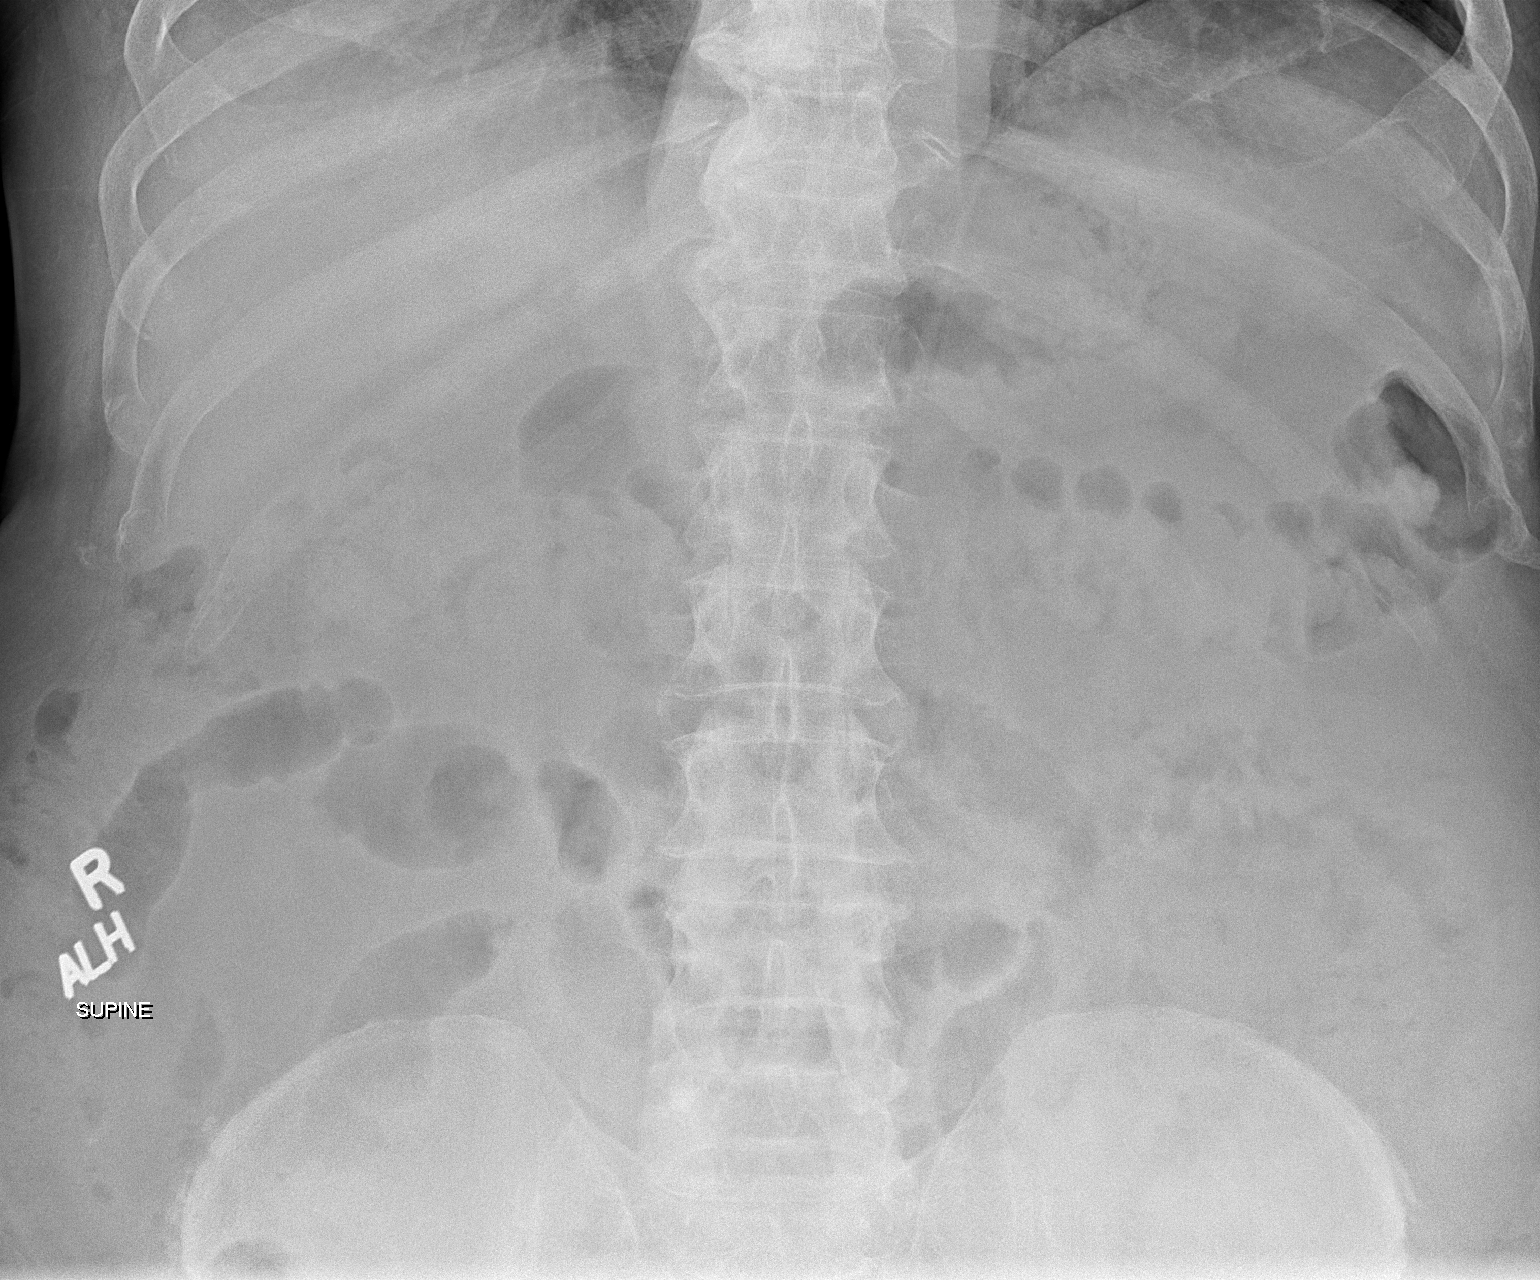

[2 of 2 positions shown; findings below may reference images not displayed]

FINDINGS: The bowel gas pattern is normal. No radio-opaque calculi or other
significant radiographic abnormality are seen. Pelvic phleboliths.
Evaluation of the kidneys is limited secondary to overlapping bowel
contents.
IMPRESSION: Negative.

## 2022-09-26 IMAGING — CT CT ABD-PEL WO/W CM
4 of 6 series · 13 of 32 positions shown, 18 images · IV contrast (agent unspecified)
Comparison: March 28, 2019 CT

CLINICAL DATA: Gross hematuria

EXAM:
CT ABDOMEN AND PELVIS WITHOUT AND WITH CONTRAST
TECHNIQUE: Multidetector CT imaging of the abdomen and pelvis was performed
following the standard protocol before and following the bolus
administration of intravenous contrast.

[Series 2: abd/pelvis w/(date) · axial · 0.98mm/px · z∈[-457,-227]mm · 3 of 94 slices shown]
[im 24/94  soft-tissue]
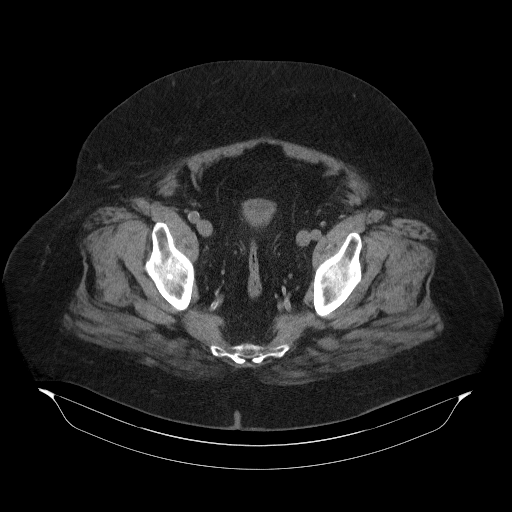
[im 47/94  soft-tissue]
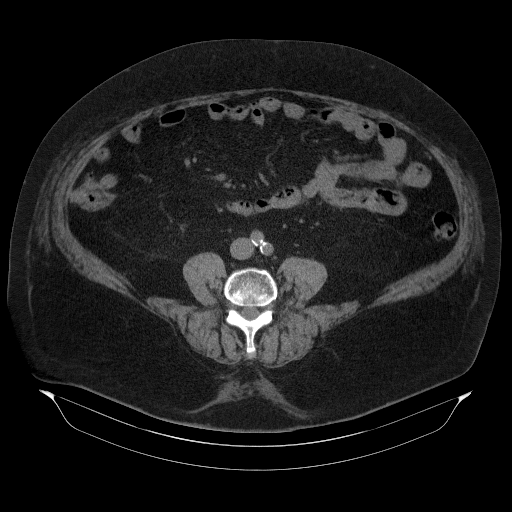
[im 70/94  soft-tissue]
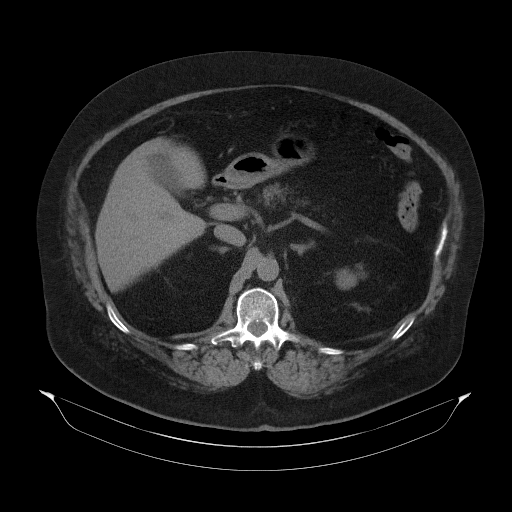

[Series 6: abd/pelvis w cont · axial · 0.98mm/px · z∈[-457,-227]mm · 3 of 94 slices shown]
[im 24/94  soft-tissue]
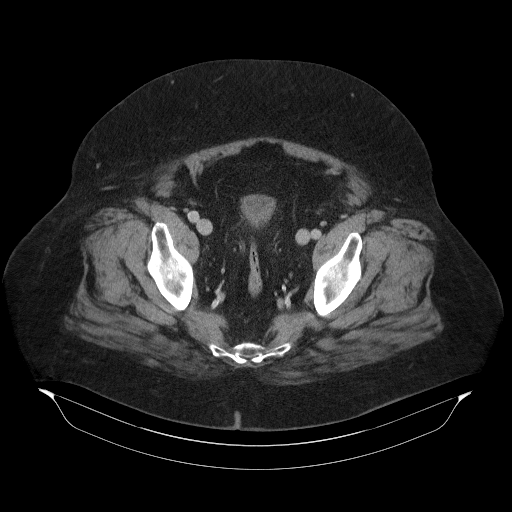
[im 47/94  soft-tissue]
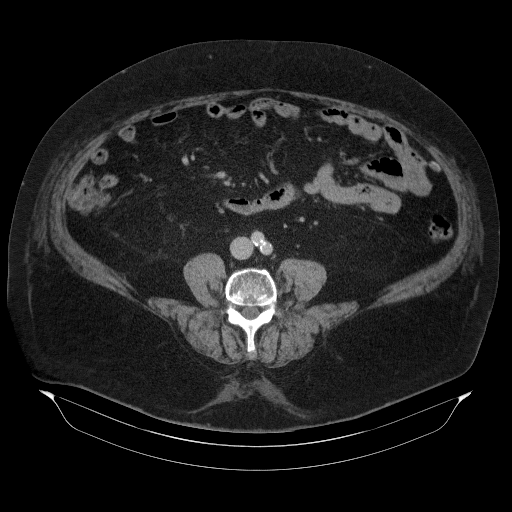
[im 70/94  soft-tissue]
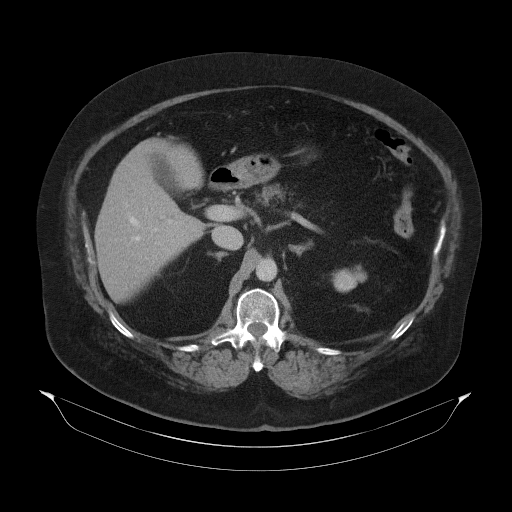

[Series 11: abd/pelvis w/cont · axial · 0.98mm/px · z∈[-386,-61]mm · 4 of 109 slices shown, 9 images]
[im 22/109  soft-tissue]
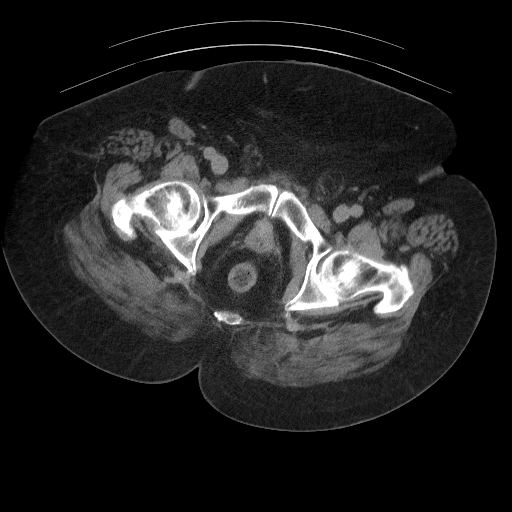
[im 22/109  lung]
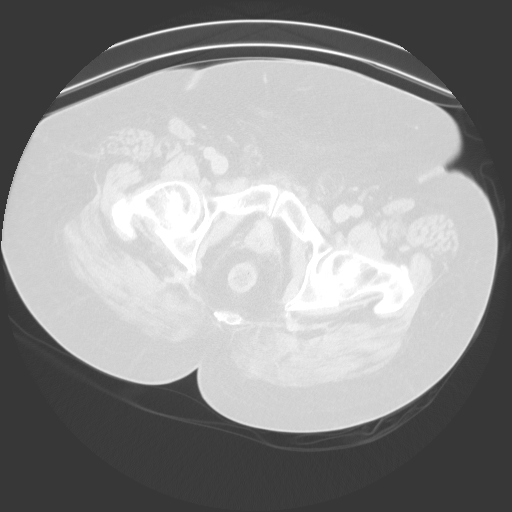
[im 22/109  bone]
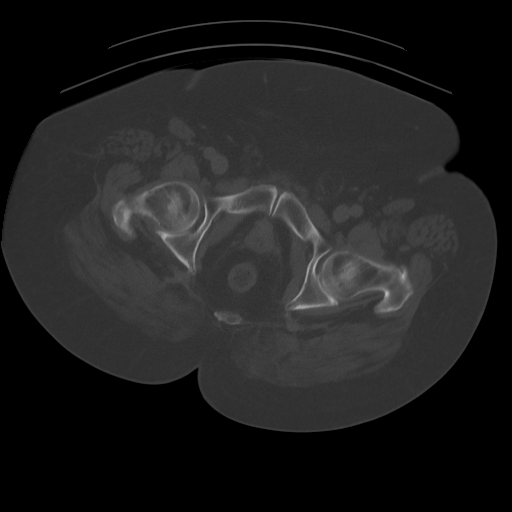
[im 44/109  soft-tissue]
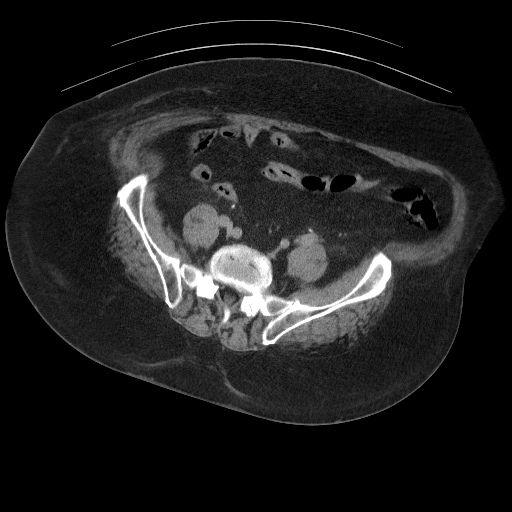
[im 44/109  lung]
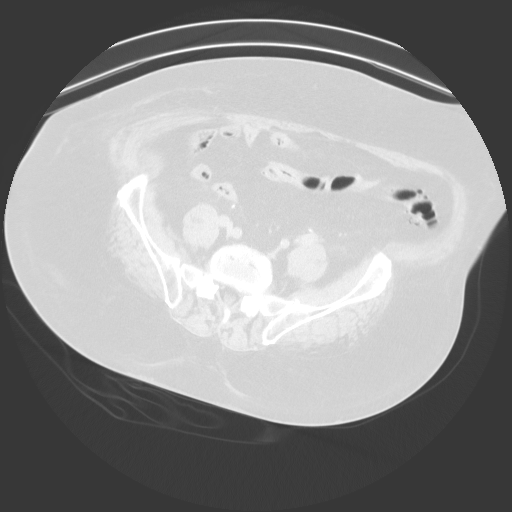
[im 65/109  soft-tissue]
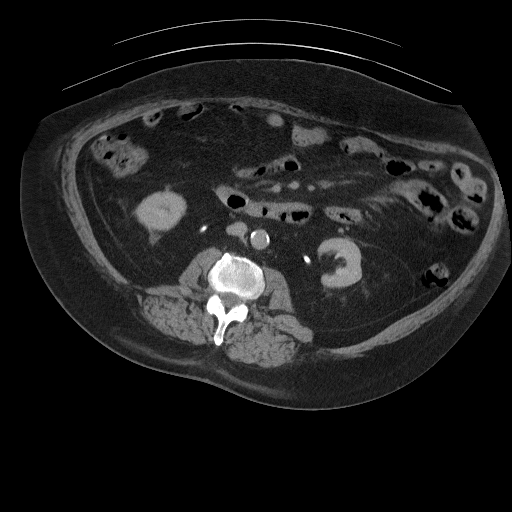
[im 65/109  lung]
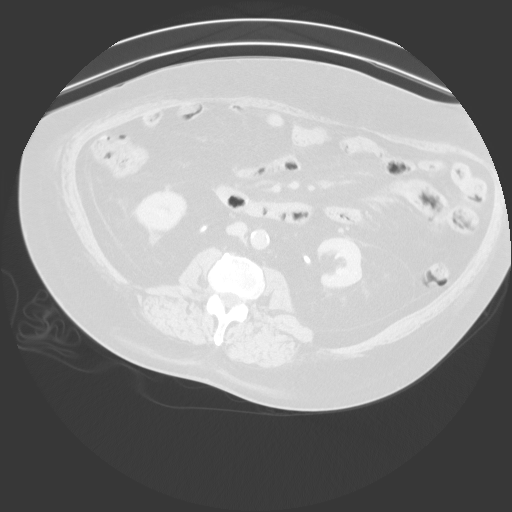
[im 87/109  soft-tissue]
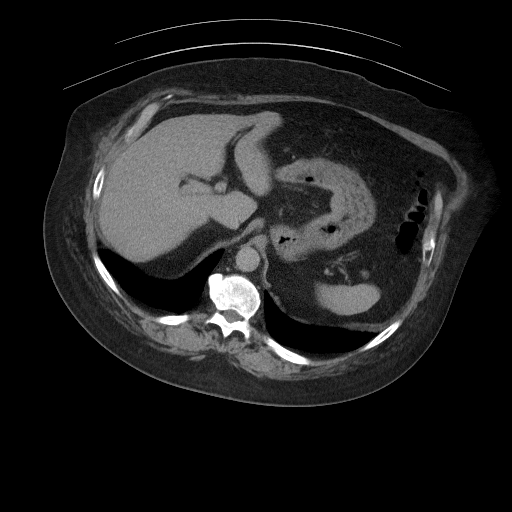
[im 87/109  lung]
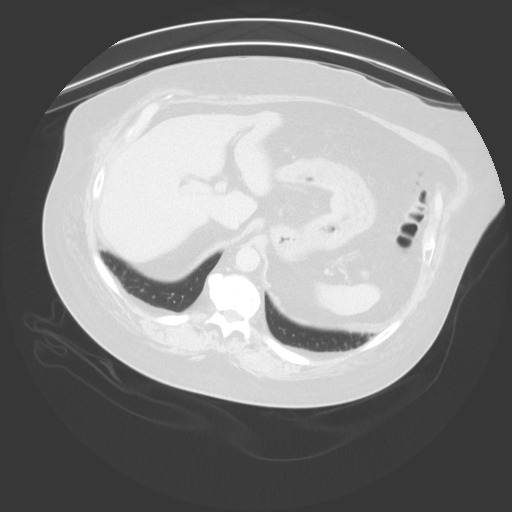

[Series 14: lung prone delay · axial · delayed · 0.98mm/px · z∈[-109,-31]mm · 3 of 99 slices shown]
[im 20/99  bone]
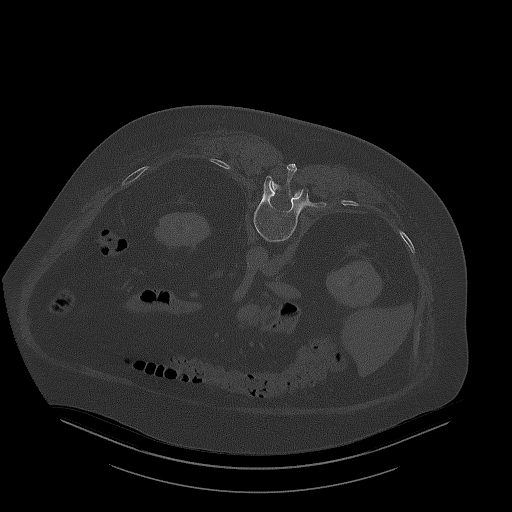
[im 40/99  bone]
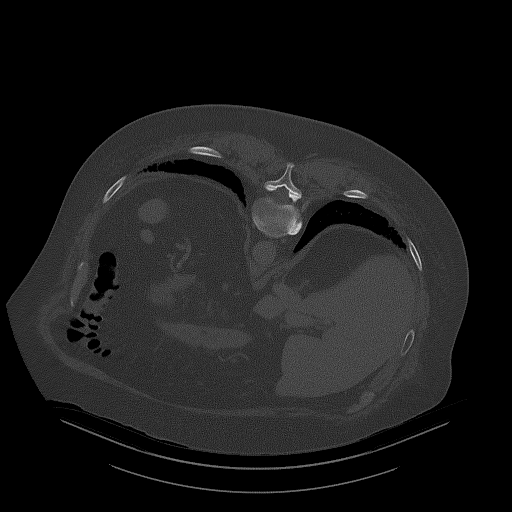
[im 59/99  bone]
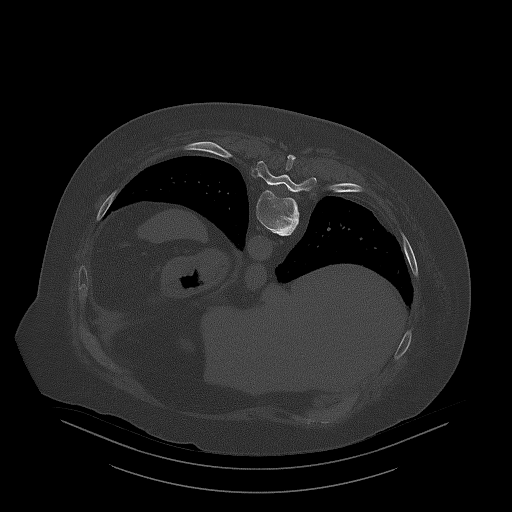

[13 of 32 positions shown; findings below may reference images not displayed]

RADIATION DOSE REDUCTION: This exam was performed according to the
departmental dose-optimization program which includes automated
exposure control, adjustment of the mA and/or kV according to
patient size and/or use of iterative reconstruction technique.

CONTRAST:  125mL [id number]-AZZ IOPAMIDOL ([id number]-AZZ) INJECTION 61%
FINDINGS: Lower chest: No acute abnormality.

Hepatobiliary: No focal hepatic lesion identified. Gallbladder is
unremarkable. No intrahepatic or extrahepatic biliary ductal
dilation.

Pancreas: Unremarkable. No pancreatic ductal dilatation or
surrounding inflammatory changes.

Spleen: Normal in size without focal abnormality.

Adrenals/Urinary Tract: Nonobstructive LEFT-sided nephrolithiasis.
Kidneys enhance symmetrically. No hydronephrosis. No obstructing
nephrolithiasis. Bladder is completely decompressed, limiting
evaluation. Adrenal glands are unremarkable.

Stomach/Bowel: Circumferential wall thickening of the distal
esophagus. No evidence of bowel obstruction. Scattered
diverticulosis. Appendix is normal. Duodenal diverticulum.

Vascular/Lymphatic: Atherosclerotic calcifications of the
nonaneurysmal aorta. No suspicious lymphadenopathy.

Reproductive: Prostate is unremarkable.

Other: Fat containing LEFT inguinal hernia. No free air or free
fluid.

Musculoskeletal: No acute or significant osseous findings.
IMPRESSION: 1. Nonobstructive punctate LEFT-sided nephrolithiasis. No etiology
for gross hematuria identified. Evaluation of the bladder is limited
due to decompression.
2. Circumferential wall thickening of the distal esophagus could
reflect underlying esophagitis. This could be confirmed with
dedicated endoscopy if clinically indicated.

Aortic Atherosclerosis (4HAHE-P6H.H).

## 2023-03-23 NOTE — Progress Notes (Deleted)
Cardiology Office Note:    Date:  03/23/2023   ID:  Adam Kim, DOB [birth year], MRN [id number]  PCP:  Galvin Proffer, MD  Cardiologist:  Nanetta Batty, MD  Electrophysiologist:  None   Referring MD: Galvin Proffer, MD   Chief Complaint: routine follow-up of CAD  History of Present Illness:    Adam Kim is a 77 y.o. 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 Allyson Sabal and presents today for routine follow-up of CAD.  Patient has a history of CAD with remote MI in 2007 and had 2 stents placed at that time (unclear to what vessels). He has done quite well since then. Echo in 09/2017 showed LVEF of 60-65% with no significant valvular disease. Myoview in 11/2017 showed findings consistent with prior apical MI with peri-infarct ischemia.   He was last seen by Dr. Allyson Sabal in 11/2021 at which time he was doing well from a cardiac standpoint.  He was seen in the ED at [facility] in 11/2022 for fatigue. Work-up (including CBC, BMET, high-sensitivity troponin, and chest x-ray) showed mildly elevated potassium of 4.7 but was otherwise unremarkable. However, patient eloped from the ED.  Patient presents today for follow-up. ***  CAD History of MI in 2007. He had 2 stents placed at that time (unclear to what vessels). Myoview in 2019 showed findings consistent with consistent with prior apical MI with peri-infarct ischemia.  - No chest pain.  - Continue Plavix monotherpay.  - Continue statin.  Hypertension BP well controlled. *** - Continue Toprol-XL 25mg  daily   Hyperlipidemia Lipid panel in ***: - Continue Crestor 5mg  daily.  Type 2 Diabetes Mellitus Hemoglobin A1c *** - On Metformin and Insulin.  - Management per PCP.  Obstructive Sleep Apnea - Continue CPAP. ***  EKGs/Labs/Other Studies Reviewed:     The following studies were reviewed:  Myoview 12/08/2017: Nuclear stress EF: 63%. Normal wall motion There was no ST segment deviation noted during stress. Defect 1: There is a medium defect present in the apical inferior and apex location. This represents infarct and ischemia. Findings consistent with prior apical inferior myocardial infarction with peri-infarct ischemia. This is an intermediate risk study.  EKG:  EKG ordered today. EKG personally reviewed and demonstrates ***.  Recent Labs: No results found for requested labs within last 365 days.  Recent Lipid Panel    Component Value Date/Time   CHOL 135 04/01/2019 1404   TRIG 99 04/01/2019 1404   HDL 39 (L) 04/01/2019 1404   CHOLHDL 3.5 04/01/2019 1404   VLDL 20 04/01/2019 1404   LDLCALC 76 04/01/2019 1404    Physical Exam:    Vital Signs: There were no vitals taken for this visit.    Wt Readings from Last 3 Encounters:  02/14/22 285 lb (129.3 kg)  11/26/21 287 lb 3.2 oz (130.3 kg)  06/01/19 282 lb (127.9 kg)     General: 77 y.o. male in no acute distress. HEENT: Normocephalic and atraumatic. Sclera clear. EOMs intact. Neck: Supple. No carotid bruits. No JVD. Heart: *** RRR. Distinct S1 and S2. No murmurs, gallops, or rubs. Radial and distal pedal pulses 2+ and equal bilaterally. Lungs: No increased work of breathing. Clear to ausculation bilaterally. No wheezes, rhonchi, or rales.  Abdomen: Soft, non-distended, and non-tender to palpation. Bowel sounds present in all 4 quadrants.  MSK: Normal strength and tone for age. *** Extremities: No lower extremity edema.    Skin: Warm and dry. Neuro: Alert and oriented x3. No focal deficits. Psych: Normal affect. Responds appropriately.   Assessment:    No diagnosis found.  Plan:     Disposition: Follow up in ***   Medication Adjustments/Labs and Tests Ordered: Current medicines are reviewed at length with the patient today.  Concerns regarding medicines are  outlined above.  No orders of the defined types were placed in this encounter.  No orders of the defined types were placed in this encounter.   There are no Patient Instructions on file for this visit.   Signed, Corrin Parker, PA-C  03/23/2023 11:07 AM    [facility]

## 2023-04-05 ENCOUNTER — Ambulatory Visit: Payer: Medicare Other | Admitting: Student

## 2023-05-01 NOTE — Progress Notes (Deleted)
Cardiology Office Note:    Date:  05/01/2023   ID:  Adam Kim, DOB 05/02/1946, MRN [id number]  PCP:  Galvin Proffer, MD  Cardiologist:  Nanetta Batty, MD  Electrophysiologist:  None   Referring MD: Galvin Proffer, MD   Chief Complaint: follow-up of CAD  History of Present Illness:    Adam Kim is a 77 y.o. 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 Allyson Sabal and presents today for routine follow-up of CAD.  Patient has a history of CAD with remote MI in 2007 and had 2 stents placed at that time (unclear to what vessels). He has done quite well since then. Echo in 09/2017 showed LVEF of 60-65% with no significant valvular disease. Myoview in 11/2017 showed findings consistent with prior apical MI with peri-infarct ischemia.   He was last seen by Dr. Allyson Sabal in 11/2021 at which time he was doing well from a cardiac standpoint.  He was seen in the ED at [facility] in 11/2022 for fatigue. Work-up (including CBC, BMET, high-sensitivity troponin, and chest x-ray) showed mildly elevated potassium of 4.7 but was otherwise unremarkable. However, patient eloped from the ED.  Patient presents today for follow-up. ***  CAD History of MI in 2007. He had 2 stents placed at that time (unclear to what vessels). Myoview in 2019 showed findings consistent with consistent with prior apical MI with peri-infarct ischemia.  - No chest pain. *** - Continue Plavix monotherpay.  - Continue statin.  Hypertension BP well controlled. *** - Continue Toprol-XL 25mg  daily   Hyperlipidemia Lipid panel in ***: - Continue Crestor 5mg  daily.  Type 2 Diabetes Mellitus Hemoglobin A1c *** - On Metformin and Insulin.  - Management per PCP.  Obstructive Sleep Apnea - Continue CPAP. ***  EKGs/Labs/Other Studies Reviewed:     The following studies were reviewed:  Echocardiogram 10/22/2017: Summary: - Normal LV size. LVEF of 60-65%. Mild concentric LVH. Normal diastolic dysfunction. - Normal RV size and function.  - Normal biatrial size.  - Aortic sclerosis. All remaining valves without significant stenosis or insufficiency.  - Grossly normal aorta.  _______________  Myoview 12/08/2017: Nuclear stress EF: 63%. Normal wall motion There was no ST segment deviation noted during stress. Defect 1: There is a medium defect present in the apical inferior and apex location. This represents infarct and ischemia. Findings consistent with prior apical inferior myocardial infarction with peri-infarct ischemia. This is an intermediate risk study.  EKG:  EKG  ordered today. EKG personally reviewed and demonstrates ***.  Recent Labs: No results found for requested labs within last 365 days.  Recent Lipid Panel    Component Value Date/Time   CHOL 135 04/01/2019 1404   TRIG 99 04/01/2019 1404   HDL 39 (L) 04/01/2019 1404   CHOLHDL 3.5 04/01/2019 1404   VLDL 20 04/01/2019 1404   LDLCALC 76 04/01/2019 1404    Physical Exam:    Vital Signs: There were no vitals taken for this visit.    Wt Readings from Last 3 Encounters:  02/14/22 285 lb (129.3 kg)  11/26/21 287 lb 3.2 oz (130.3 kg)  06/01/19 282 lb (127.9 kg)     General: 77 y.o. male in no acute distress. HEENT: Normocephalic and atraumatic. Sclera clear. EOMs intact. Neck: Supple. No carotid bruits. No JVD. Heart: *** RRR. Distinct  S1 and S2. No murmurs, gallops, or rubs. Radial and distal pedal pulses 2+ and equal bilaterally. Lungs: No increased work of breathing. Clear to ausculation bilaterally. No wheezes, rhonchi, or rales.  Abdomen: Soft, non-distended, and non-tender to palpation. Bowel sounds present in all 4 quadrants.  MSK: Normal strength and tone for age. *** Extremities: No lower extremity edema.    Skin: Warm and dry. Neuro: Alert and  oriented x3. No focal deficits. Psych: Normal affect. Responds appropriately.   Assessment:    No diagnosis found.  Plan:     Disposition: Follow up in ***   Medication Adjustments/Labs and Tests Ordered: Current medicines are reviewed at length with the patient today.  Concerns regarding medicines are outlined above.  No orders of the defined types were placed in this encounter.  No orders of the defined types were placed in this encounter.   There are no Patient Instructions on file for this visit.   Signed, Corrin Parker, PA-C  05/01/2023 11:14 PM    [facility]

## 2023-05-04 ENCOUNTER — Ambulatory Visit: Payer: Medicare Other | Admitting: Student

## 2023-08-10 ENCOUNTER — Ambulatory Visit: Payer: Medicare Other | Attending: Cardiovascular Disease | Admitting: Cardiovascular Disease

## 2023-08-11 ENCOUNTER — Encounter: Payer: Self-pay | Admitting: Cardiovascular Disease

## 2023-11-02 ENCOUNTER — Ambulatory Visit: Payer: Medicare Other | Admitting: Cardiovascular Disease

## 2024-01-17 ENCOUNTER — Telehealth: Payer: Self-pay | Admitting: Cardiovascular Disease

## 2024-01-17 ENCOUNTER — Encounter: Payer: Self-pay | Admitting: Cardiology

## 2024-01-17 NOTE — Telephone Encounter (Signed)
 Error

## 2024-01-17 NOTE — Telephone Encounter (Signed)
 Called pt to inform him according to his chart the heart attack was before 10/26/2017. I do not see an exact date. No answer, unable to leave a message. Voicemail full.

## 2024-01-17 NOTE — Telephone Encounter (Signed)
 Patient would like to know if someone can call him back to let him know exactly when he had a heart attack in the past. Please advise.

## 2024-10-27 ENCOUNTER — Ambulatory Visit (INDEPENDENT_AMBULATORY_CARE_PROVIDER_SITE_OTHER)

## 2024-10-27 ENCOUNTER — Ambulatory Visit: Admitting: Podiatry

## 2024-10-27 DIAGNOSIS — G5782 Other specified mononeuropathies of left lower limb: Secondary | ICD-10-CM

## 2024-10-27 DIAGNOSIS — G629 Polyneuropathy, unspecified: Secondary | ICD-10-CM | POA: Diagnosis not present

## 2024-10-27 NOTE — Progress Notes (Unsigned)
 Left third nterspace neuroma, injected.

## 2024-11-24 ENCOUNTER — Ambulatory Visit: Admitting: Podiatry
